# Patient Record
Sex: Male | Born: 1955 | Race: White | Hispanic: No | Marital: Married | Smoking: Current some day smoker
Health system: Southern US, Community
[De-identification: ages and names within clinical notes are randomized; demographics above are authoritative.]

## PROBLEM LIST (undated history)

## (undated) DIAGNOSIS — T7840XA Allergy, unspecified, initial encounter: Secondary | ICD-10-CM

## (undated) DIAGNOSIS — C4490 Unspecified malignant neoplasm of skin, unspecified: Secondary | ICD-10-CM

## (undated) DIAGNOSIS — E785 Hyperlipidemia, unspecified: Secondary | ICD-10-CM

## (undated) DIAGNOSIS — R251 Tremor, unspecified: Secondary | ICD-10-CM

## (undated) DIAGNOSIS — F909 Attention-deficit hyperactivity disorder, unspecified type: Secondary | ICD-10-CM

## (undated) HISTORY — PX: OTHER SURGICAL HISTORY: SHX169

## (undated) HISTORY — DX: Attention-deficit hyperactivity disorder, unspecified type: F90.9

## (undated) HISTORY — DX: Unspecified malignant neoplasm of skin, unspecified: C44.90

## (undated) HISTORY — DX: Tremor, unspecified: R25.1

## (undated) HISTORY — DX: Allergy, unspecified, initial encounter: T78.40XA

## (undated) HISTORY — DX: Hyperlipidemia, unspecified: E78.5

---

## 1977-05-08 HISTORY — PX: KNEE SURGERY: SHX244

## 2004-03-15 ENCOUNTER — Ambulatory Visit: Payer: Self-pay | Admitting: Family Medicine

## 2004-04-13 ENCOUNTER — Ambulatory Visit: Payer: Self-pay | Admitting: Family Medicine

## 2004-06-08 ENCOUNTER — Ambulatory Visit: Payer: Self-pay | Admitting: Family Medicine

## 2004-08-25 ENCOUNTER — Ambulatory Visit: Payer: Self-pay | Admitting: Family Medicine

## 2005-01-26 ENCOUNTER — Ambulatory Visit: Payer: Self-pay | Admitting: Family Medicine

## 2005-02-01 ENCOUNTER — Ambulatory Visit: Payer: Self-pay | Admitting: Family Medicine

## 2005-02-20 ENCOUNTER — Ambulatory Visit: Payer: Self-pay | Admitting: Family Medicine

## 2005-06-01 ENCOUNTER — Ambulatory Visit: Payer: Self-pay | Admitting: Family Medicine

## 2006-02-09 ENCOUNTER — Ambulatory Visit: Payer: Self-pay | Admitting: Family Medicine

## 2006-02-15 ENCOUNTER — Ambulatory Visit: Payer: Self-pay | Admitting: Family Medicine

## 2006-05-24 ENCOUNTER — Ambulatory Visit: Payer: Self-pay

## 2006-05-24 ENCOUNTER — Ambulatory Visit: Payer: Self-pay | Admitting: *Deleted

## 2007-01-04 DIAGNOSIS — J309 Allergic rhinitis, unspecified: Secondary | ICD-10-CM | POA: Insufficient documentation

## 2007-01-21 ENCOUNTER — Telehealth: Payer: Self-pay | Admitting: Family Medicine

## 2007-02-11 ENCOUNTER — Telehealth: Payer: Self-pay | Admitting: Family Medicine

## 2007-02-12 ENCOUNTER — Ambulatory Visit: Payer: Self-pay | Admitting: Family Medicine

## 2007-02-12 LAB — CONVERTED CEMR LAB
ALT: 23 units/L (ref 0–53)
Alkaline Phosphatase: 53 units/L (ref 39–117)
CO2: 31 meq/L (ref 19–32)
Calcium: 9.5 mg/dL (ref 8.4–10.5)
Chloride: 106 meq/L (ref 96–112)
Eosinophils Absolute: 0.2 10*3/uL (ref 0.0–0.6)
GFR calc Af Amer: 102 mL/min
GFR calc non Af Amer: 84 mL/min
Glucose, Bld: 91 mg/dL (ref 70–99)
HDL: 36 mg/dL — ABNORMAL LOW (ref >39.0)
LDL Cholesterol: 100 mg/dL — ABNORMAL HIGH (ref 0–99)
MCHC: 35.3 g/dL (ref 30.0–36.0)
MCV: 95.6 fL (ref 78.0–100.0)
Neutro Abs: 2.5 10*3/uL (ref 1.4–7.7)
Neutrophils Relative %: 49.8 % (ref 43.0–77.0)
Potassium: 5 meq/L (ref 3.5–5.1)
Total CHOL/HDL Ratio: 4.3
Total Protein: 6.6 g/dL (ref 6.0–8.3)
Triglycerides: 99 mg/dL (ref 0–149)
WBC: 5.2 10*3/uL (ref 4.5–10.5)

## 2007-03-05 ENCOUNTER — Telehealth: Payer: Self-pay | Admitting: Family Medicine

## 2007-03-08 ENCOUNTER — Encounter: Payer: Self-pay | Admitting: Family Medicine

## 2007-03-11 ENCOUNTER — Telehealth: Payer: Self-pay | Admitting: Family Medicine

## 2007-03-18 ENCOUNTER — Telehealth (INDEPENDENT_AMBULATORY_CARE_PROVIDER_SITE_OTHER): Payer: Self-pay | Admitting: *Deleted

## 2007-03-29 ENCOUNTER — Telehealth: Payer: Self-pay | Admitting: Family Medicine

## 2007-04-19 ENCOUNTER — Telehealth: Payer: Self-pay | Admitting: Family Medicine

## 2007-06-12 ENCOUNTER — Ambulatory Visit: Payer: Self-pay | Admitting: Internal Medicine

## 2007-06-25 ENCOUNTER — Encounter: Payer: Self-pay | Admitting: Internal Medicine

## 2007-06-25 ENCOUNTER — Ambulatory Visit: Payer: Self-pay | Admitting: Internal Medicine

## 2007-08-12 ENCOUNTER — Telehealth: Payer: Self-pay | Admitting: Family Medicine

## 2007-08-20 ENCOUNTER — Telehealth (INDEPENDENT_AMBULATORY_CARE_PROVIDER_SITE_OTHER): Payer: Self-pay | Admitting: *Deleted

## 2007-08-30 ENCOUNTER — Ambulatory Visit: Payer: Self-pay | Admitting: Family Medicine

## 2007-10-01 ENCOUNTER — Telehealth: Payer: Self-pay | Admitting: Family Medicine

## 2007-10-02 ENCOUNTER — Ambulatory Visit: Payer: Self-pay | Admitting: Family Medicine

## 2007-12-03 ENCOUNTER — Ambulatory Visit: Payer: Self-pay | Admitting: Family Medicine

## 2007-12-03 LAB — CONVERTED CEMR LAB
AST: 23 units/L (ref 0–37)
Albumin: 4.2 g/dL (ref 3.5–5.2)
BUN: 13 mg/dL (ref 6–23)
Basophils Absolute: 0 10*3/uL (ref 0.0–0.1)
Bilirubin, Direct: 0.1 mg/dL (ref 0.0–0.3)
Chloride: 104 meq/L (ref 96–112)
Eosinophils Relative: 4.4 % (ref 0.0–5.0)
GFR calc Af Amer: 91 mL/min
HCT: 48.3 % (ref 39.0–52.0)
LDL Cholesterol: 87 mg/dL (ref 0–99)
MCHC: 33.7 g/dL (ref 30.0–36.0)
MCV: 100.2 fL — ABNORMAL HIGH (ref 78.0–100.0)
Monocytes Absolute: 0.5 10*3/uL (ref 0.1–1.0)
Monocytes Relative: 9.8 % (ref 3.0–12.0)
Nitrite: NEGATIVE
PSA: 0.33 ng/mL (ref 0.10–4.00)
Platelets: 257 10*3/uL (ref 150–400)
RDW: 12.1 % (ref 11.5–14.6)
Sodium: 140 meq/L (ref 135–145)
Specific Gravity, Urine: 1.025
TSH: 1.08 microintl units/mL (ref 0.35–5.50)
Total CHOL/HDL Ratio: 4
VLDL: 25 mg/dL (ref 0–40)
WBC Urine, dipstick: NEGATIVE

## 2007-12-10 ENCOUNTER — Ambulatory Visit: Payer: Self-pay | Admitting: Family Medicine

## 2007-12-10 DIAGNOSIS — R3129 Other microscopic hematuria: Secondary | ICD-10-CM | POA: Insufficient documentation

## 2007-12-10 DIAGNOSIS — E785 Hyperlipidemia, unspecified: Secondary | ICD-10-CM | POA: Insufficient documentation

## 2007-12-10 DIAGNOSIS — G250 Essential tremor: Secondary | ICD-10-CM

## 2008-03-23 DIAGNOSIS — J1189 Influenza due to unidentified influenza virus with other manifestations: Secondary | ICD-10-CM

## 2008-03-24 ENCOUNTER — Ambulatory Visit: Payer: Self-pay | Admitting: Family Medicine

## 2008-05-12 ENCOUNTER — Ambulatory Visit: Payer: Self-pay | Admitting: Family Medicine

## 2008-05-12 DIAGNOSIS — R209 Unspecified disturbances of skin sensation: Secondary | ICD-10-CM | POA: Insufficient documentation

## 2008-10-01 ENCOUNTER — Ambulatory Visit: Payer: Self-pay | Admitting: Family Medicine

## 2008-12-28 ENCOUNTER — Telehealth: Payer: Self-pay | Admitting: *Deleted

## 2009-02-23 ENCOUNTER — Telehealth: Payer: Self-pay | Admitting: Family Medicine

## 2009-02-23 DIAGNOSIS — F17200 Nicotine dependence, unspecified, uncomplicated: Secondary | ICD-10-CM

## 2009-03-05 ENCOUNTER — Ambulatory Visit: Payer: Self-pay | Admitting: Family Medicine

## 2009-03-08 ENCOUNTER — Telehealth: Payer: Self-pay | Admitting: Family Medicine

## 2009-03-18 ENCOUNTER — Ambulatory Visit: Payer: Self-pay | Admitting: Family Medicine

## 2009-03-18 LAB — CONVERTED CEMR LAB
ALT: 22 units/L (ref 0–53)
AST: 26 units/L (ref 0–37)
Albumin: 4.5 g/dL (ref 3.5–5.2)
Alkaline Phosphatase: 57 units/L (ref 39–117)
BUN: 20 mg/dL (ref 6–23)
Basophils Absolute: 0 10*3/uL (ref 0.0–0.1)
Basophils Relative: 0 % (ref 0.0–3.0)
Bilirubin Urine: NEGATIVE
Bilirubin, Direct: 0 mg/dL (ref 0.0–0.3)
CO2: 29 meq/L (ref 19–32)
Calcium: 9.7 mg/dL (ref 8.4–10.5)
Chloride: 109 meq/L (ref 96–112)
Cholesterol: 159 mg/dL (ref 0–200)
Creatinine, Ser: 1.1 mg/dL (ref 0.4–1.5)
Eosinophils Absolute: 0.2 10*3/uL (ref 0.0–0.7)
Eosinophils Relative: 3.1 % (ref 0.0–5.0)
GFR calc non Af Amer: 74.57 mL/min (ref >60)
Glucose, Bld: 102 mg/dL — ABNORMAL HIGH (ref 70–99)
Glucose, Urine, Semiquant: NEGATIVE
HCT: 45.1 % (ref 39.0–52.0)
HDL: 35.1 mg/dL — ABNORMAL LOW (ref >39.00)
Hemoglobin: 16 g/dL (ref 13.0–17.0)
Ketones, urine, test strip: NEGATIVE
LDL Cholesterol: 99 mg/dL (ref 0–99)
Lymphocytes Relative: 29.1 % (ref 12.0–46.0)
Lymphs Abs: 1.5 10*3/uL (ref 0.7–4.0)
MCHC: 35.6 g/dL (ref 30.0–36.0)
MCV: 99 fL (ref 78.0–100.0)
Monocytes Absolute: 0.4 10*3/uL (ref 0.1–1.0)
Monocytes Relative: 7.9 % (ref 3.0–12.0)
Neutro Abs: 2.9 10*3/uL (ref 1.4–7.7)
Neutrophils Relative %: 59.9 % (ref 43.0–77.0)
Nitrite: NEGATIVE
PSA: 0.38 ng/mL (ref 0.10–4.00)
Platelets: 229 10*3/uL (ref 150.0–400.0)
Potassium: 5 meq/L (ref 3.5–5.1)
Protein, U semiquant: NEGATIVE
RBC: 4.55 M/uL (ref 4.22–5.81)
RDW: 11.7 % (ref 11.5–14.6)
Sodium: 148 meq/L — ABNORMAL HIGH (ref 135–145)
Specific Gravity, Urine: 1.025
TSH: 1.46 microintl units/mL (ref 0.35–5.50)
Total Bilirubin: 0.9 mg/dL (ref 0.3–1.2)
Total CHOL/HDL Ratio: 5
Total Protein: 7.7 g/dL (ref 6.0–8.3)
Triglycerides: 123 mg/dL (ref 0.0–149.0)
Urobilinogen, UA: 0.2
VLDL: 24.6 mg/dL (ref 0.0–40.0)
WBC Urine, dipstick: NEGATIVE
WBC: 5 10*3/uL (ref 4.5–10.5)
pH: 5.5

## 2009-04-05 ENCOUNTER — Ambulatory Visit: Payer: Self-pay | Admitting: Family Medicine

## 2009-04-05 DIAGNOSIS — I471 Supraventricular tachycardia, unspecified: Secondary | ICD-10-CM | POA: Insufficient documentation

## 2009-04-05 DIAGNOSIS — M542 Cervicalgia: Secondary | ICD-10-CM

## 2009-05-20 ENCOUNTER — Ambulatory Visit: Payer: Self-pay | Admitting: Family Medicine

## 2009-10-13 ENCOUNTER — Telehealth: Payer: Self-pay | Admitting: Family Medicine

## 2009-10-18 ENCOUNTER — Telehealth: Payer: Self-pay | Admitting: *Deleted

## 2010-02-07 ENCOUNTER — Telehealth: Payer: Self-pay | Admitting: Family Medicine

## 2010-02-23 ENCOUNTER — Telehealth: Payer: Self-pay | Admitting: Family Medicine

## 2010-03-24 ENCOUNTER — Ambulatory Visit: Payer: Self-pay | Admitting: Family Medicine

## 2010-03-24 LAB — CONVERTED CEMR LAB
AST: 22 units/L (ref 0–37)
Albumin: 4.2 g/dL (ref 3.5–5.2)
Alkaline Phosphatase: 50 units/L (ref 39–117)
BUN: 25 mg/dL — ABNORMAL HIGH (ref 6–23)
Basophils Relative: 0.4 % (ref 0.0–3.0)
Chloride: 104 meq/L (ref 96–112)
Cholesterol: 158 mg/dL (ref 0–200)
Eosinophils Absolute: 0.2 10*3/uL (ref 0.0–0.7)
Eosinophils Relative: 3 % (ref 0.0–5.0)
Glucose, Bld: 100 mg/dL — ABNORMAL HIGH (ref 70–99)
HCT: 46.2 % (ref 39.0–52.0)
LDL Cholesterol: 103 mg/dL — ABNORMAL HIGH (ref 0–99)
Lymphocytes Relative: 34.1 % (ref 12.0–46.0)
MCV: 98.7 fL (ref 78.0–100.0)
Monocytes Absolute: 0.5 10*3/uL (ref 0.1–1.0)
Monocytes Relative: 9.7 % (ref 3.0–12.0)
Neutrophils Relative %: 52.8 % (ref 43.0–77.0)
Platelets: 224 10*3/uL (ref 150.0–400.0)
Potassium: 4.7 meq/L (ref 3.5–5.1)
Protein, U semiquant: NEGATIVE
Sodium: 140 meq/L (ref 135–145)
TSH: 1.74 microintl units/mL (ref 0.35–5.50)
Total Protein: 6.7 g/dL (ref 6.0–8.3)
Urobilinogen, UA: 0.2

## 2010-04-11 ENCOUNTER — Ambulatory Visit: Payer: Self-pay | Admitting: Family Medicine

## 2010-04-11 ENCOUNTER — Encounter: Payer: Self-pay | Admitting: Family Medicine

## 2010-06-09 NOTE — Assessment & Plan Note (Signed)
Summary: n/congestion/ha/njr   Vital Signs:  Patient profile:   55 year old male Weight:      208 pounds O2 Sat:      95 % on Room air Temp:     100.1 degrees F oral Pulse rate:   84 / minute BP sitting:   120 / 76  (left arm)  Vitals Entered By: Doristine Devoid (May 20, 2009 9:43 AM)  O2 Flow:  Room air CC: sinus congestion and bodyaches along w/ chills xmon.    CC:  sinus congestion and bodyaches along w/ chills xmon. Marland Kitchen  History of Present Illness: greg is a 55 year old, married male, nonsmoker, who came to in with a 5-day history of fever, chills, aching all over, sore throat, head congestion, nonproductive cough.  Temp today is 101.  He states his daughter had similar symptoms.  Review of systems negative  Allergies: No Known Drug Allergies  Review of Systems      See HPI  Physical Exam  General:  Well-developed,well-nourished,in no acute distress; alert,appropriate and cooperative throughout examination Head:  Normocephalic and atraumatic without obvious abnormalities. No apparent alopecia or balding. Eyes:  No corneal or conjunctival inflammation noted. EOMI. Perrla. Funduscopic exam benign, without hemorrhages, exudates or papilledema. Vision grossly normal. Ears:  External ear exam shows no significant lesions or deformities.  Otoscopic examination reveals clear canals, tympanic membranes are intact bilaterally without bulging, retraction, inflammation or discharge. Hearing is grossly normal bilaterally. Nose:  External nasal examination shows no deformity or inflammation. Nasal mucosa are pink and moist without lesions or exudates. Mouth:  Oral mucosa and oropharynx without lesions or exudates.  Teeth in good repair. Neck:  No deformities, masses, or tenderness noted. Chest Wall:  No deformities, masses, tenderness or gynecomastia noted. Breasts:  No masses or gynecomastia noted Lungs:  Normal respiratory effort, chest expands symmetrically. Lungs are clear to  auscultation, no crackles or wheezes.   Problems:  Medical Problems Added: 1)  Dx of Influenza, With Respiratory Symptoms  (ICD-487.1)  Impression & Recommendations:  Problem # 1:  INFLUENZA, WITH RESPIRATORY SYMPTOMS (ICD-487.1) Assessment New  Orders: Prescription Created Electronically [phone])  Complete Medication List: 1)  Simvastatin 10 Mg Tabs (Simvastatin) .Marland Kitchen.. 1 tab at bedtime; cpx when due 2)  Bayer Aspirin 325 Mg Tabs (Aspirin) .... Once daily 3)  Multivitamins Caps (Multiple vitamin) .... Once daily 4)  Zyrtec Allergy 10 Mg Tabs (Cetirizine hcl) .... Once daily 5)  Valium 5 Mg Tabs (Diazepam) .Marland Kitchen.. 1 tab one hour prior to flying. 6)  Hydromet 5-1.5 Mg/20ml Syrp (Hydrocodone-homatropine) .Marland Kitchen.. 1 or 2 tsps three times a day as needed  Patient Instructions: 1)  Get plenty of rest, drink lots of clear liquids, and use Tylenol or Ibuprofen for fever and comfort. Return in 7-10 days if you're not better:sooner if you're feeling worse. 2)  Take 650-1000mg  of Tylenol every 4-6 hours as needed for relief of pain or comfort of fever AVOID taking more than 4000mg   in a 24 hour period (can cause liver damage in higher doses). 3)  drink 30 ounces of water daily, run a vaporizer in her bedroom, take one or 2 teaspoons of Hydromet, up to 3 times a day as needed. 4)  Return p.r.n.Marland Kitchen Prescriptions: HYDROMET 5-1.5 MG/5ML SYRP (HYDROCODONE-HOMATROPINE) 1 or 2 tsps three times a day as needed  #8oz x 1   Entered and Authorized by:   Roderick Pee MD   Signed by:   Roderick Pee MD on 05/20/2009  Method used:   Print then Give to Patient   RxID:   [id number]   Laboratory Results    Other Tests  Influenza A: negative Influenza B: negative  Kit Test Internal QC: Positive   (Normal Range: Negative)

## 2010-06-09 NOTE — Progress Notes (Signed)
Summary: REQ FOR ORDER  Phone Note Call from Patient   Caller: Patient   Summary of Call: Pt called to schedule a cpx appt for 04/16/2010 .... Pt is requesting that he obtain an order for chest x-ray and a throat x-ray? due to the amount of cigars that he smokes on a daily basis.... Can you advise order for same?   Initial call taken by: Debbra Riding,  February 07, 2010 10:55 AM  Follow-up for Phone Call        we will discuss screening tests when he comes in for his appointment Follow-up by: Roderick Pee MD,  February 07, 2010 11:57 AM  Additional Follow-up for Phone Call Additional follow up Details #1::        LMOMTCB.  Additional Follow-up by: Debbra Riding,  February 07, 2010 12:36 PM

## 2010-06-09 NOTE — Assessment & Plan Note (Signed)
Summary: CPX // RS/PT [facility] FROM [facility]   Vital Signs:  Patient profile:   55 year old male Height:      69.75 inches Weight:      209 pounds BMI:     30.31 Temp:     98.0 degrees F oral BP sitting:   120 / 78  (left arm) Cuff size:   regular  Vitals Entered By: Kern Reap CMA Duncan Dull) (April 11, 2010 4:00 PM) CC: cpx Is Patient Diabetic? No Pain Assessment Patient in pain? no        CC:  cpx.  History of Present Illness: Sharlot Gowda is a 55 year old, married male, nonsmoker, who comes in today for a general physical examination because of a history of hyperlipidemia, allergic rhinitis.  He takes simvastatin 10 mg nightly and aspirin tablet daily with [place] or go.  He also takes 10 mg of plain Zyrtec at bedtime for allergic rhinitis and a 5-mg Valium tablets prior to flying.  Review of systems negative except is having some muscle spasm of his right sternocleidomastoid muscle.  Review of systems negative.  Tetanus 2004 seasonal flu shot 2011 colonoscopy normal 2008  Allergies (verified): No Known Drug Allergies  Past History:  Past medical, surgical, family and social histories (including risk factors) reviewed for relevance to current acute and chronic problems.  Past Medical History: Reviewed history from 12/10/2007 and no changes required. Allergic rhinitis ADHD Benign Essential tremor Asy, hematuria Hyperlipidemia  Past Surgical History: Reviewed history from 01/04/2007 and no changes required. Right knee sx  Family History: Reviewed history from 01/04/2007 and no changes required. Family History of Aneurysm Aortic Family History Diabetes 1st degree relative Family History Hypertension Family History Other cancer-breast Family History of Stroke M 1st degree relative <50  Social History: Reviewed history from 12/10/2007 and no changes required. Married Never Smoked Alcohol use-yes Drug use-no Occupation: Sport and exercise psychologist  Review of Systems     See HPI  Physical Exam  General:  Well-developed,well-nourished,in no acute distress; alert,appropriate and cooperative throughout examination Head:  Normocephalic and atraumatic without obvious abnormalities. No apparent alopecia or balding. Eyes:  No corneal or conjunctival inflammation noted. EOMI. Perrla. Funduscopic exam benign, without hemorrhages, exudates or papilledema. Vision grossly normal. Ears:  External ear exam shows no significant lesions or deformities.  Otoscopic examination reveals clear canals, tympanic membranes are intact bilaterally without bulging, retraction, inflammation or discharge. Hearing is grossly normal bilaterally. Nose:  External nasal examination shows no deformity or inflammation. Nasal mucosa are pink and moist without lesions or exudates. Mouth:  Oral mucosa and oropharynx without lesions or exudates.  Teeth in good repair. Neck:  No deformities, masses, or tenderness noted. Chest Wall:  No deformities, masses, tenderness or gynecomastia noted. Breasts:  No masses or gynecomastia noted Lungs:  Normal respiratory effort, chest expands symmetrically. Lungs are clear to auscultation, no crackles or wheezes. Heart:  Normal rate and regular rhythm. S1 and S2 normal without gallop, murmur, click, rub or other extra sounds. Abdomen:  Bowel sounds positive,abdomen soft and non-tender without masses, organomegaly or hernias noted. Rectal:  No external abnormalities noted. Normal sphincter tone. No rectal masses or tenderness. Genitalia:  Testes bilaterally descended without nodularity, tenderness or masses. No scrotal masses or lesions. No penis lesions or urethral discharge. Prostate:  Prostate gland firm and smooth, no enlargement, nodularity, tenderness, mass, asymmetry or induration. Msk:  No deformity or scoliosis noted of thoracic or lumbar spine.   Pulses:  R and L carotid,radial,femoral,dorsalis pedis  and posterior tibial pulses are full and equal  bilaterally Extremities:  No clubbing, cyanosis, edema, or deformity noted with normal full range of motion of all joints.   Neurologic:  No cranial nerve deficits noted. Station and gait are normal. Plantar reflexes are down-going bilaterally. DTRs are symmetrical throughout. Sensory, motor and coordinative functions appear intact. Skin:  total body skin exam normal except for scar in his nose.  Left shoulder from previous skin cancer removed by Dr. Yetta Barre Cervical Nodes:  No lymphadenopathy noted Axillary Nodes:  No palpable lymphadenopathy Inguinal Nodes:  No significant adenopathy Psych:  Cognition and judgment appear intact. Alert and cooperative with normal attention span and concentration. No apparent delusions, illusions, hallucinations   Impression & Recommendations:  Problem # 1:  TREMOR, ESSENTIAL (ICD-333.1) Assessment Unchanged  Orders: Prescription Created Electronically [id number])  Problem # 2:  HYPERLIPIDEMIA (ICD-272.4) Assessment: Improved  His updated medication list for this problem includes:    Simvastatin 10 Mg Tabs (Simvastatin) .Marland Kitchen... 1 tab at bedtime; cpx when due  Orders: EKG w/ Interpretation (93000)  Problem # 3:  PHYSICAL EXAMINATION (ICD-V70.0) Assessment: Unchanged  Orders: Prescription Created Electronically [phone]) EKG w/ Interpretation (93000)  Complete Medication List: 1)  Simvastatin 10 Mg Tabs (Simvastatin) .Marland Kitchen.. 1 tab at bedtime; cpx when due 2)  Bayer Aspirin 325 Mg Tabs (Aspirin) .... Once daily 3)  Multivitamins Caps (Multiple vitamin) .... Once daily 4)  Zyrtec Allergy 10 Mg Tabs (Cetirizine hcl) .... Once daily 5)  Valium 5 Mg Tabs (Diazepam) .Marland Kitchen.. 1 tab one hour prior to flying.  Other Orders: Physical Therapy Referral (PT)  Patient Instructions: 1)  I will set up an appointment for you to see Jeanene Erb, my physical therapist to help you with the neck spasms 2)  Please schedule a follow-up appointment in 1 year. 3)  It is  important that you exercise regularly at least 20 minutes 5 times a week. If you develop chest pain, have severe difficulty breathing, or feel very tired , stop exercising immediately and seek medical attention. 4)  Take an Aspirin every day. Prescriptions: VALIUM 5 MG TABS (DIAZEPAM) 1 tab one hour prior to flying.  #10 x 1    Entered and Authorized by:   Roderick Pee MD   Signed by:   Roderick Pee MD on 04/11/2010   Method used:   Print then Give to Patient   RxID:   [id number] SIMVASTATIN 10 MG  TABS (SIMVASTATIN) 1 tab at bedtime; cpx when due  #100 x 3   Entered and Authorized by:   Roderick Pee MD   Signed by:   Roderick Pee MD on 04/11/2010   Method used:   Electronically to        [facility]* (retail)       [address]       [place], [place]  [id number]       Ph: [phone]       Fax: [phone]   RxID:   [id number]    Orders Added: 1)  Physical Therapy Referral [PT] 2)  Prescription Created Electronically [G8553] 3)  Est. Patient 40-64 years [99396] 4)  EKG w/ Interpretation [93000]      Preventive Care Screening  Colonoscopy:    Date:  05/08/2006    Results:  normal

## 2010-06-09 NOTE — Progress Notes (Signed)
Summary: REQ FOR ORDER   Phone Note Call from Patient   Caller: Patient  [phone] Reason for Call: Talk to Doctor Summary of Call: Pt called to adv that he is still exp cramping in his throat / neck area.... Pt adv that he would like to have x-rays done of his neck to help find out what is going on with his throat / neck.... Pt requesting order for same... Pt adv that he has been in to see Dr Tawanna Cooler about this problem...Marland KitchenMarland KitchenCan you advise?  Initial call taken by: Debbra Riding,  October 18, 2009 3:19 PM  Follow-up for Phone Call        [place]......... please call before I would expose him to any radiation.  I would recommend he see Dr. Ezzard Standing again or if they can't find out what the problem.  This will get him set up to see a neurologist Follow-up by: Roderick Pee MD,  October 18, 2009 3:36 PM  Additional Follow-up for Phone Call Additional follow up Details #1::        Called pt explained that Dr. Ezzard Standing is a specialist for throats and that would be the best person to id his problem. Told him to call us back if he doesn't get any answers & we can send him to a neurologist. Additional Follow-up by: Kathrynn Speed CMA,  October 19, 2009 9:49 AM

## 2010-06-09 NOTE — Progress Notes (Signed)
Summary: neck cramping & getting tight  Phone Note Call from Patient Call back at Home Phone [phone]   Caller: vm Summary of Call: Problems with neck cramping & getting tight.  A couple months ago had CXR but did not have neck & jaw underneath xrays.  Dr. Ezzard Standing, ENT, saw me also.  Then cut off.    Initial call taken by: Rudy Jew, RN,  October 13, 2009 4:50 PM  Follow-up for Phone Call        ov Follow-up by: Roderick Pee MD,  October 14, 2009 8:34 AM

## 2010-06-09 NOTE — Progress Notes (Signed)
Summary: Pt wants chest and neck xray done before sch cpx  Phone Note Call from Patient Call back at Home Phone [phone]   Caller: Patient Summary of Call: Pt is req to get chest and neck / throat xray done before he comes in for his cpx, so that way, Dr. Tawanna Cooler will have the results and can go over them during his cpx. Pls order the above mentioned xray and notify pt. Pt says that he will pay for xrays out of pocket.  Initial call taken by: Lucy Antigua,  February 23, 2010 9:49 AM  Follow-up for Phone Call        after we see him and examined him and then we will discuss imaging if necessary Follow-up by: Roderick Pee MD,  February 23, 2010 10:55 AM  Additional Follow-up for Phone Call Additional follow up Details #1::        left message on machine  Additional Follow-up by: Kern Reap CMA Duncan Dull),  February 23, 2010 12:14 PM

## 2010-07-04 ENCOUNTER — Encounter: Payer: Self-pay | Admitting: Family Medicine

## 2010-07-04 ENCOUNTER — Ambulatory Visit (INDEPENDENT_AMBULATORY_CARE_PROVIDER_SITE_OTHER): Payer: BC Managed Care – PPO | Admitting: Family Medicine

## 2010-07-04 VITALS — BP 110/80 | Temp 98.3°F | Ht 70.5 in | Wt 213.0 lb

## 2010-07-04 DIAGNOSIS — R5383 Other fatigue: Secondary | ICD-10-CM

## 2010-07-04 DIAGNOSIS — E785 Hyperlipidemia, unspecified: Secondary | ICD-10-CM

## 2010-07-04 DIAGNOSIS — R5381 Other malaise: Secondary | ICD-10-CM

## 2010-07-04 LAB — POCT URINALYSIS DIPSTICK
Bilirubin, UA: NEGATIVE
Glucose, UA: NEGATIVE
Leukocytes, UA: NEGATIVE
pH, UA: 6

## 2010-07-04 NOTE — Progress Notes (Signed)
  Subjective:    Patient ID: Dakota Barker, male    DOB: [birth year], 55 y.o.   MRN: [id number]  HPI  Dakota Barker is a 55 year old,  married male, smoker,,,,,,,,, cigars,,,,,,,, who comes in today for evaluation of fatigue x 3 to 4 months.  He states he feels tired.  He has no energy.  He comes home from work and has taken that he sleeps throughout the night except he gets up once or twice to urinate.  He's had no fever, earache, sore throat, cough, no nausea, vomiting, diarrhea, etc., etc.  Weight is steady.  He continues to exercise and is somewhat sporadic basis.  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  He is married and has a 93 year old daughter in Social worker school at [employer].  A66 54-year-old in [place] and a 54 year old son in [place] so children all doing well.  Personal life is good.  Marriage is okay.  Family history non-contributory.  Except half brother with diabetes  Review of Systems  Constitutional: Negative.   HENT: Negative.   Eyes: Negative.   Respiratory: Negative.   Cardiovascular: Negative.   Gastrointestinal: Negative.   Genitourinary: Negative.   Musculoskeletal: Negative.   Skin: Negative.   Neurological: Negative.   Hematological: Negative.   Psychiatric/Behavioral: Negative.        Objective:   Physical Exam  Constitutional: He appears well-developed and well-nourished. No distress.  Skin: He is not diaphoretic.  Psychiatric: He has a normal mood and affect. His behavior is normal. Judgment and thought content normal.          Assessment & Plan:  Fatigue,,,,,,,,, begin workup

## 2010-07-04 NOTE — Patient Instructions (Signed)
I will call you I gets y  lab work back

## 2010-07-05 LAB — BASIC METABOLIC PANEL
CO2: 28 mEq/L (ref 19–32)
Calcium: 9.3 mg/dL (ref 8.4–10.5)
Glucose, Bld: 90 mg/dL (ref 70–99)
Potassium: 4.7 mEq/L (ref 3.5–5.1)
Sodium: 139 mEq/L (ref 135–145)

## 2010-07-05 LAB — CBC WITH DIFFERENTIAL/PLATELET
Basophils Absolute: 0 10*3/uL (ref 0.0–0.1)
Eosinophils Absolute: 0.2 10*3/uL (ref 0.0–0.7)
HCT: 43.8 % (ref 39.0–52.0)
Hemoglobin: 15.2 g/dL (ref 13.0–17.0)
Lymphs Abs: 1.8 10*3/uL (ref 0.7–4.0)
MCHC: 34.6 g/dL (ref 30.0–36.0)
Neutro Abs: 4.5 10*3/uL (ref 1.4–7.7)
RDW: 12.9 % (ref 11.5–14.6)

## 2010-07-05 LAB — T3, FREE: T3, Free: 2.7 pg/mL (ref 2.3–4.2)

## 2010-07-05 LAB — HEPATIC FUNCTION PANEL: Albumin: 4.2 g/dL (ref 3.5–5.2)

## 2010-07-05 LAB — LDL CHOLESTEROL, DIRECT: Direct LDL: 87.8 mg/dL

## 2010-07-05 LAB — T4, FREE: Free T4: 0.91 ng/dL (ref 0.60–1.60)

## 2010-07-05 LAB — LIPID PANEL
Cholesterol: 145 mg/dL (ref 0–200)
Triglycerides: 260 mg/dL — ABNORMAL HIGH (ref 0.0–149.0)

## 2010-07-05 LAB — TSH: TSH: 1.26 u[IU]/mL (ref 0.35–5.50)

## 2010-07-25 ENCOUNTER — Telehealth: Payer: Self-pay | Admitting: Family Medicine

## 2010-07-25 NOTE — Telephone Encounter (Signed)
Left message on machine for patient  No generic available

## 2010-07-25 NOTE — Telephone Encounter (Signed)
Please send a generic for testosterone to [facility]. Patient does not know what is covered.

## 2010-08-01 NOTE — Telephone Encounter (Signed)
Pt called back and said that whatever testosterone is covered is fine. Pls call in to [facility] today.

## 2010-08-01 NOTE — Telephone Encounter (Signed)
Please call ............... The patient needs to call his insurance company to find out what they cover and then let us know what they will pay for

## 2010-09-01 ENCOUNTER — Encounter: Payer: Self-pay | Admitting: Family Medicine

## 2010-09-01 ENCOUNTER — Ambulatory Visit (INDEPENDENT_AMBULATORY_CARE_PROVIDER_SITE_OTHER): Payer: BC Managed Care – PPO | Admitting: Family Medicine

## 2010-09-01 VITALS — BP 110/70 | Temp 98.2°F | Wt 210.0 lb

## 2010-09-01 DIAGNOSIS — R5383 Other fatigue: Secondary | ICD-10-CM

## 2010-09-01 NOTE — Patient Instructions (Signed)
We will get to set up a consult with Dr. Everardo All

## 2010-09-01 NOTE — Progress Notes (Signed)
  Subjective:    Patient ID: Dakota Barker, male    DOB: 1955/07/25, 55 y.o.   MRN: [id number]  HPIGregg is a 55 year old, married male, nonsmoker comes in today for follow-up of fatigue.  We did a complete metabolic workup.  Barker his tests are normal except a slightly low testosterone level.  He denies any history of sexual dysfunction.  Low libido, etc..  We had a thorough discussion.  We talked about the options.  I would recommend we get a consult from Dakota Barker to see if he feels this is indeed a metabolic problem and if Dr. Everardo Barker does not, feel it's a metabolic problem, then I think we are to get a consult from psychiatry.  Dakota Barker    Review of Systems Complete review of systems otherwise negative    Objective:   Physical Exam Well-developed well-nourished, male in no acute distress       Assessment & Plan:  Fatigue etiology unknown question related to low testosterone,,,,,,,,,, refer to Dr. Everardo Barker for consult

## 2010-09-12 ENCOUNTER — Encounter: Payer: Self-pay | Admitting: Endocrinology

## 2010-09-12 ENCOUNTER — Ambulatory Visit (INDEPENDENT_AMBULATORY_CARE_PROVIDER_SITE_OTHER): Payer: BC Managed Care – PPO | Admitting: Endocrinology

## 2010-09-12 ENCOUNTER — Other Ambulatory Visit: Payer: BC Managed Care – PPO

## 2010-09-12 ENCOUNTER — Ambulatory Visit (INDEPENDENT_AMBULATORY_CARE_PROVIDER_SITE_OTHER)
Admission: RE | Admit: 2010-09-12 | Discharge: 2010-09-12 | Disposition: A | Discharge status: Home / Self Care | Payer: BC Managed Care – PPO | Source: Ambulatory Visit | Attending: Family Medicine | Admitting: Family Medicine

## 2010-09-12 VITALS — BP 106/62 | HR 69 | Temp 98.5°F | Ht 71.0 in | Wt 209.0 lb

## 2010-09-12 DIAGNOSIS — E291 Testicular hypofunction: Secondary | ICD-10-CM

## 2010-09-12 DIAGNOSIS — R5383 Other fatigue: Secondary | ICD-10-CM

## 2010-09-12 NOTE — Progress Notes (Signed)
Subjective:    Patient ID: Dakota Barker, male    DOB: [birth year], 55 y.o.   MRN: [id number]  HPI Pt states few years of fatigue, and mild palpitations in the chest, but no assoc insomnia. Past Medical History  Diagnosis Date  . Allergy   . ADHD (attention deficit hyperactivity disorder)   . Tremor     benign, essential  . Hematuria   . Hyperlipidemia     Past Surgical History  Procedure Date  . Knee surgery     right    History   Social History  . Marital Status: Married    Spouse Name: N/A    Number of Children: N/A  . Years of Education: N/A   Occupational History  . Not on file.   Social History Main Topics  . Smoking status: Current Some Day Smoker -- 2 years    Types: Cigars  . Smokeless tobacco: Never Used  . Alcohol Use: Yes  . Drug Use: No  . Sexually Active:    Other Topics Concern  . Not on file   Social History Narrative  . No narrative on file  married. Works Field seismologist. Current Outpatient Prescriptions on File Prior to Visit  Medication Sig Dispense Refill  . aspirin 325 MG tablet Take 325 mg by mouth daily.        . cetirizine (ZYRTEC) 10 MG chewable tablet Chew 10 mg by mouth as needed.       . diazepam (VALIUM) 5 MG tablet Take 5 mg by mouth as needed. For travel      . Multiple Vitamin (MULTIVITAMIN) tablet Take 1 tablet by mouth daily.        . simvastatin (ZOCOR) 10 MG tablet Take 10 mg by mouth at bedtime.          No Known Allergies  Family History  Problem Relation Age of Onset  . Aneurysm Other     aortic  . Diabetes Other   . Hypertension Other   . Cancer Other     breast  . Stroke Other     less 50 years    BP 106/62  Pulse 69  Temp(Src) 98.5 F (36.9 C) (Oral)  Ht 5\' 11"  (1.803 m)  Wt 209 lb (94.802 kg)  BMI 29.15 kg/m2  SpO2 96%    Review of Systems denies polyuria, depression, numbness, erectile dysfunction, weight change, decreased urinary stream, gynecomastia, muscle weakness, fever,   headache, easy bruising, sob, rash, blurry vision, chest pain.   denies hoarseness, double vision, palpitations, sob, diarrhea, myalgias, excessive diaphoresis, tremor, anxiety, hypoglycemia, and rhinorrhea     Objective:   Physical Exam VS: see vs page GEN: no distress HEAD: head: no deformity eyes: no periorbital swelling, no proptosis external nose and ears are normal mouth: no lesion seen NECK: supple, thyroid is not enlarged CHEST WALL: no deformity BREASTS:  No gynecomastia CV: reg rate and rhythm, no murmur ABD: abdomen is soft, nontender.  no hepatosplenomegaly.  not distended.  no hernia GENITALIA:  Normal male, except testicles are small and soft. MUSCULOSKELETAL: muscle bulk and strength are grossly normal.  no obvious joint swelling.  gait is normal and steady EXTEMITIES: no deformity.  no ulcer on the feet.  feet are of normal color and temp.  no edema PULSES: dorsalis pedis intact bilat.  no carotid bruit NEURO:  cn 2-12 grossly intact.   readily moves all 4's.  sensation is intact to touch on the feet SKIN:  Normal texture and temperature.  No rash or suspicious lesion is visible.   NODES:  None palpable at the neck PSYCH: alert, oriented x3.  Does not appear anxious nor depressed.     Lab Results  Component Value Date   HGBA1C 5.6 07/04/2010   Lab Results  Component Value Date   TSH 1.26 07/04/2010   Lab Results  Component Value Date   TESTOSTERONE 540.16 09/13/2010      Assessment & Plan:  Palpitations, not thyroid-related Hypogonadism, uncertain etiology, resolved. Fatigue, uncertain etiology

## 2010-09-12 NOTE — Patient Instructions (Addendum)
blood tests are being ordered for you today.  please call [phone] to hear your test results.  You will be prompted to enter the 9-digit "MRN" number that appears at the top left of this page, followed by #.  Then you will hear the message. If symptoms are a few hrs after eating, it could be "reactive hypoglycemia" (in which the body's insulin is too little, too late), resulting in transient mildly low blood sugar.  This would be a sign that you are developing diabetes.  The treatment of this is to minimize body weight.  This is something you should do anyway, as you are at risk for diabetes. Return here as needed.   (update: i left message on phone-tree:  rx as we discussed)

## 2010-09-13 ENCOUNTER — Other Ambulatory Visit: Payer: BC Managed Care – PPO

## 2010-09-13 DIAGNOSIS — E291 Testicular hypofunction: Secondary | ICD-10-CM

## 2010-09-13 LAB — TESTOSTERONE: Testosterone: 540.16 ng/dL (ref 250–890)

## 2010-09-23 NOTE — Assessment & Plan Note (Signed)
 HEALTHCARE                            CARDIOLOGY OFFICE NOTE   KHARY, SCHABEN                   MRN:          [id number]  DATE:05/24/2006                            DOB:          27-Jun-1955    Mr. Chain is a very pleasant 55 year old white married male with  history of hyperlipidemia, recently started on Zocor 10 and Niaspan.  He  called in for an evaluation concerning recent peri sternal and left  sternal chest discomfort.  This is non exertional, lasts only a few  seconds, is not nocturnal.  He questiones whether it may occur after  eating.  Risk factors, in addition to hyperlipidemia, include 1 cigar  approximately once a week, occasional beer.   PAST HOSPITALIZATIONS:  For broken heel and knee surgery.   REVIEW OF SYSTEMS:  HEAD, EARS, EYES, NOSE, AND THROAT:  Unremarkable.  CARDIORESPIRATORY:  Noted in the present illness.  GI HISTORY:  Negative except for question of reflux.  GU HISTORY:  Had some nocturia a year and a half ago.  MUSCULOSKELETAL HISTORY:  Unremarkable.  All other review of systems negative.   ALLERGIES:  TO GRASS.  HE TAKES ZYRTEC.   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 [employer].  He walks 20-30 minutes a day.  Married and has 3 children.   FAMILY HISTORY:  Father died at age 51 with congestive heart failure,  mother died of breast cancer.  Maternal grandfather died at 19 of a  heart attack.  He has 2 older brothers, 1 of whom has diabetes, age 34.   PHYSICAL EXAMINATION:  Blood pressure initially was 92/70, repeat was  110/80 left arm, 120/82 right arm.  Pulse 69, normal sinus rhythm.  GENERAL APPEARANCE:  Normal.  HEAD, EARS, NOSE, AND THROAT:  Unremarkable.  JVP is not elevated.  Carotid pulse is palpable and equal without bruits.  LUNGS:  Clear.  CARDIO EXAM:  Reveals no murmur, gallop, or rub.  ABDOMINAL EXAM:  Liver, spleen, kidney not palpable, no masses.  EXTREMITIES:  No edema.  Pulses palpable and equal bilaterally.  NEUROLOGICAL:  Unremarkable.   Baseline EKG is normal.   IMPRESSION:  1. Atypical chest pain.  2. Hyperlipidemia, on therapy.  3. Cigar usage.   I have recommended standard treadmill exercise electrogram with the  report to follow.  I have also strongly recommended he discontinue the  cigar smoking.     Cecil Cranker, MD, [facility]  Electronically Signed    EJL/MedQ  DD: 05/24/2006  DT: 05/24/2006  Job #: [id number]   cc:   Tinnie Gens A. Tawanna Cooler, MD

## 2010-09-23 NOTE — Procedures (Signed)
[facility]                              EXERCISE TREADMILL   KEM, HENSEN                     MRN:          [id number]  DATE:05/24/2006                            DOB:          02-Jun-1955    _Treadmill exercise tolerance test:  The patient has had some recent  atypical chest discomfort, has had a history of hyperlipidemia and  smokes a cigar approximately once a week.  Otherwise, no significant  risk factors.  The patient exercised using the Bruce protocol,  continuing through 3 minutes stage IV.  Maximum heart rate was 151,  which was 90% of predicted maximum.  Blood pressure 163/61.  He had no  symptoms.  There was a rare PVC and no ST-T abnormality.   IMPRESSION:  Negative treadmill exercise electrocardiogram with no  evidence of ST abnormality or symptoms.  He had normal blood pressure  response and there were rare PVCs.     Cecil Cranker, MD, [facility]  Electronically Signed    EJL/MedQ  DD: 05/24/2006  DT: 05/24/2006  Job #: [id number]   cc:   Tinnie Gens A. Tawanna Cooler, MD

## 2010-12-30 ENCOUNTER — Other Ambulatory Visit: Payer: Self-pay | Admitting: Family Medicine

## 2010-12-30 MED ORDER — DIAZEPAM 5 MG PO TABS: 5.0000 mg | ORAL_TABLET | ORAL | Status: DC | PRN

## 2010-12-30 NOTE — Telephone Encounter (Signed)
Pt req refill of diazepam (VALIUM) 5 MG tablet to [facility]. Pt will be traveling end of next wk.

## 2011-01-24 ENCOUNTER — Encounter: Payer: Self-pay | Admitting: Family Medicine

## 2011-01-24 ENCOUNTER — Ambulatory Visit (INDEPENDENT_AMBULATORY_CARE_PROVIDER_SITE_OTHER): Payer: BC Managed Care – PPO | Admitting: Family Medicine

## 2011-01-24 VITALS — BP 120/80 | Temp 98.4°F | Wt 200.0 lb

## 2011-01-24 DIAGNOSIS — R42 Dizziness and giddiness: Secondary | ICD-10-CM | POA: Insufficient documentation

## 2011-01-24 DIAGNOSIS — J309 Allergic rhinitis, unspecified: Secondary | ICD-10-CM

## 2011-01-24 DIAGNOSIS — Z23 Encounter for immunization: Secondary | ICD-10-CM

## 2011-01-24 NOTE — Patient Instructions (Signed)
Return prn 

## 2011-01-24 NOTE — Progress Notes (Signed)
  Subjective:    Patient ID: Dakota Barker, male    DOB: [birth year], 54 y.o.   MRN: [id number]  HPI  Dakota Barker is a  55 year old male, nonsmoker, who comes in today for evaluation of vertigo.  He states last week.  He was in [place] got off an airplane and developed vertigo.  Since that, time it's been intermittent and associated with turning his head to the right.  It lasts for 30 to 60 seconds and go away.  He has two to 3 episodes a day.  He's learned that she doesn't turn his head to the right and he does not develop vertigo.  He has no hearing loss, headache, et Karie Soda.  He's had a history of vertigo about two years ago.  That resolved spontaneously.   Review of Systems    General ENT, and review of systems negative Objective:   Physical Exam Well-developed well-nourished, male, no acute distress.  Examination of the HEENT were negative.  No papilledema.  Neck is supple.  No adenopathy.  Cerebellar testing, negative       Assessment & Plan:  Benign positional vertigo.  Plan reassured return p.r.n.

## 2011-03-13 ENCOUNTER — Telehealth: Payer: Self-pay | Admitting: Family Medicine

## 2011-03-13 NOTE — Telephone Encounter (Signed)
We will get his chest x-ray, the same day.  He comes in

## 2011-03-13 NOTE — Telephone Encounter (Signed)
Pt would like to have chest xray prior to cpx 2013 due to cigar smoker.

## 2011-03-15 NOTE — Telephone Encounter (Signed)
patient  Is aware 

## 2011-04-06 ENCOUNTER — Other Ambulatory Visit (INDEPENDENT_AMBULATORY_CARE_PROVIDER_SITE_OTHER): Payer: BC Managed Care – PPO

## 2011-04-06 DIAGNOSIS — Z0000 Encounter for general adult medical examination without abnormal findings: Secondary | ICD-10-CM

## 2011-04-06 LAB — CBC WITH DIFFERENTIAL/PLATELET
Basophils Relative: 0.4 % (ref 0.0–3.0)
Eosinophils Relative: 2 % (ref 0.0–5.0)
HCT: 47.8 % (ref 39.0–52.0)
Hemoglobin: 16.1 g/dL (ref 13.0–17.0)
Lymphs Abs: 1.4 10*3/uL (ref 0.7–4.0)
Monocytes Relative: 8.8 % (ref 3.0–12.0)
Neutro Abs: 3.7 10*3/uL (ref 1.4–7.7)
RBC: 4.74 Mil/uL (ref 4.22–5.81)
WBC: 5.7 10*3/uL (ref 4.5–10.5)

## 2011-04-06 LAB — POCT URINALYSIS DIPSTICK
Nitrite, UA: NEGATIVE
Spec Grav, UA: 1.02
Urobilinogen, UA: 1
pH, UA: 8.5

## 2011-04-06 LAB — LIPID PANEL
Cholesterol: 164 mg/dL (ref 0–200)
HDL: 49.2 mg/dL (ref >39.00)
LDL Cholesterol: 98 mg/dL (ref 0–99)
Total CHOL/HDL Ratio: 3
Triglycerides: 83 mg/dL (ref 0.0–149.0)
VLDL: 16.6 mg/dL (ref 0.0–40.0)

## 2011-04-06 LAB — HEPATIC FUNCTION PANEL
ALT: 18 U/L (ref 0–53)
Bilirubin, Direct: 0.1 mg/dL (ref 0.0–0.3)
Total Bilirubin: 0.7 mg/dL (ref 0.3–1.2)

## 2011-04-06 LAB — BASIC METABOLIC PANEL
GFR: 68.91 mL/min (ref >60.00)
Glucose, Bld: 98 mg/dL (ref 70–99)
Potassium: 5.9 mEq/L — ABNORMAL HIGH (ref 3.5–5.1)
Sodium: 144 mEq/L (ref 135–145)

## 2011-04-13 ENCOUNTER — Ambulatory Visit (INDEPENDENT_AMBULATORY_CARE_PROVIDER_SITE_OTHER): Payer: BC Managed Care – PPO | Admitting: Family Medicine

## 2011-04-13 ENCOUNTER — Encounter: Payer: Self-pay | Admitting: Family Medicine

## 2011-04-13 VITALS — BP 120/78 | Temp 98.7°F | Ht 70.5 in | Wt 190.0 lb

## 2011-04-13 DIAGNOSIS — G252 Other specified forms of tremor: Secondary | ICD-10-CM

## 2011-04-13 DIAGNOSIS — J309 Allergic rhinitis, unspecified: Secondary | ICD-10-CM

## 2011-04-13 DIAGNOSIS — Z0000 Encounter for general adult medical examination without abnormal findings: Secondary | ICD-10-CM

## 2011-04-13 DIAGNOSIS — E785 Hyperlipidemia, unspecified: Secondary | ICD-10-CM

## 2011-04-13 DIAGNOSIS — Z87891 Personal history of nicotine dependence: Secondary | ICD-10-CM

## 2011-04-13 DIAGNOSIS — R3129 Other microscopic hematuria: Secondary | ICD-10-CM

## 2011-04-13 DIAGNOSIS — G250 Essential tremor: Secondary | ICD-10-CM

## 2011-04-13 MED ORDER — SIMVASTATIN 10 MG PO TABS: 10.0000 mg | ORAL_TABLET | Freq: Every day | ORAL | Status: DC

## 2011-04-13 NOTE — Progress Notes (Signed)
  Subjective:    Patient ID: Dakota Barker, male    DOB: 04/07/1956, 55 y.o.   MRN: [id number]  HPI G.  is a 55 year old, married man smoker........ Cigars.......Marland KitchenComes in today for annual physical examination because of a history of allergic rhinitis anxiety and hyperlipidemia and essential tremor and tobacco abuse.  He has allergic rhinitis, for which he takes Zyrtec 10 mg nightly  He takes Valium p.r.n. For anxiety advised he began working out at the gym instead of taking Valium.  He takes Zocor 10 mg nightly along with an aspirin tablet for hyper lipidemia.  Lipids are at goal.  History of microscopic hematuria asymptomatic.  Urine unchanged.  Trace but intact.  Red cells.  He's had a history of tobacco abuse, smoking one or two cigars a day.  Again, advised to stop.  Patient requesting chest x-ray   Review of Systems  Constitutional: Negative.   HENT: Negative.   Eyes: Negative.   Respiratory: Negative.   Cardiovascular: Negative.   Gastrointestinal: Negative.   Genitourinary: Negative.   Musculoskeletal: Negative.   Skin: Negative.   Neurological: Negative.   Hematological: Negative.   Psychiatric/Behavioral: Negative.        Objective:   Physical Exam  Constitutional: He is oriented to person, place, and time. He appears well-developed and well-nourished.  HENT:  Head: Normocephalic and atraumatic.  Right Ear: External ear normal.  Left Ear: External ear normal.  Nose: Nose normal.  Mouth/Throat: Oropharynx is clear and moist.  Eyes: Conjunctivae and EOM are normal. Pupils are equal, round, and reactive to light.  Neck: Normal range of motion. Neck supple. No JVD present. No tracheal deviation present. No thyromegaly present.  Cardiovascular: Normal rate, regular rhythm, normal heart sounds and intact distal pulses.  Exam reveals no gallop and no friction rub.   No murmur heard. Pulmonary/Chest: Effort normal and breath sounds normal. No stridor. No respiratory  distress. He has no wheezes. He has no rales. He exhibits no tenderness.  Abdominal: Soft. Bowel sounds are normal. He exhibits no distension and no mass. There is no tenderness. There is no rebound and no guarding.  Genitourinary: Rectum normal, prostate normal and penis normal. Guaiac negative stool. No penile tenderness.  Musculoskeletal: Normal range of motion. He exhibits no edema and no tenderness.  Lymphadenopathy:    He has no cervical adenopathy.  Neurological: He is alert and oriented to person, place, and time. He has normal reflexes. No cranial nerve deficit. He exhibits normal muscle tone.  Skin: Skin is warm and dry. No rash noted. No erythema. No pallor.  Psychiatric: He has a normal mood and affect. His behavior is normal. Judgment and thought content normal.          Assessment & Plan:  Healthy male.  Allergic rhinitis.  Continue Zyrtec 10 mg daily.  Mild anxiety related to job stress and home situation.  We recommend exercise program.  Hyperlipidemia.  Continue Zocor and aspirin tablet.  Tobacco abuse again, advised to stop smoking.  Chest x-ray, per patient request

## 2011-04-13 NOTE — Patient Instructions (Signed)
Continue your current medications.  Again, and consider discontinuing the [place] cigars !!!!!!!!!!!!!!!!  Go to the main office across from [facility] for your chest x-ray.  Because of the stress in y  Life...............  I would recommend you begin an exercise program 30 minutes daily.  Return in one year or sooner if any problem

## 2011-04-19 ENCOUNTER — Ambulatory Visit (INDEPENDENT_AMBULATORY_CARE_PROVIDER_SITE_OTHER)
Admission: RE | Admit: 2011-04-19 | Discharge: 2011-04-19 | Disposition: A | Discharge status: Home / Self Care | Payer: BC Managed Care – PPO | Source: Ambulatory Visit | Attending: Family Medicine | Admitting: Family Medicine

## 2011-04-19 DIAGNOSIS — Z87891 Personal history of nicotine dependence: Secondary | ICD-10-CM

## 2011-04-19 DIAGNOSIS — Z0000 Encounter for general adult medical examination without abnormal findings: Secondary | ICD-10-CM

## 2012-03-12 ENCOUNTER — Telehealth: Payer: Self-pay | Admitting: Family Medicine

## 2012-03-12 NOTE — Telephone Encounter (Addendum)
Scheduled CPX for pt. 04/22/12. Pt is aware.

## 2012-03-12 NOTE — Telephone Encounter (Signed)
Pt needs CPX done by Dec. 31 per job requirement. His last was 12./7. Could we schedule him sometime in Dec for that CPX.?  Also, pt. states he would like to schedule his annual chest xray too.

## 2012-03-12 NOTE — Telephone Encounter (Signed)
Okay to work in

## 2012-04-11 ENCOUNTER — Other Ambulatory Visit (INDEPENDENT_AMBULATORY_CARE_PROVIDER_SITE_OTHER): Payer: BC Managed Care – PPO

## 2012-04-11 DIAGNOSIS — Z0000 Encounter for general adult medical examination without abnormal findings: Secondary | ICD-10-CM

## 2012-04-11 LAB — CBC WITH DIFFERENTIAL/PLATELET
Basophils Relative: 0.3 % (ref 0.0–3.0)
Eosinophils Absolute: 0.1 10*3/uL (ref 0.0–0.7)
Eosinophils Relative: 2.9 % (ref 0.0–5.0)
Hemoglobin: 15.4 g/dL (ref 13.0–17.0)
Lymphocytes Relative: 32.7 % (ref 12.0–46.0)
MCHC: 33.6 g/dL (ref 30.0–36.0)
MCV: 96.7 fl (ref 78.0–100.0)
Neutro Abs: 2.8 10*3/uL (ref 1.4–7.7)
Neutrophils Relative %: 55 % (ref 43.0–77.0)
RBC: 4.73 Mil/uL (ref 4.22–5.81)
WBC: 5.1 10*3/uL (ref 4.5–10.5)

## 2012-04-11 LAB — POCT URINALYSIS DIPSTICK
Bilirubin, UA: NEGATIVE
Ketones, UA: NEGATIVE
Protein, UA: NEGATIVE
Spec Grav, UA: 1.02
pH, UA: 7

## 2012-04-11 LAB — LIPID PANEL
HDL: 36.4 mg/dL — ABNORMAL LOW (ref >39.00)
LDL Cholesterol: 103 mg/dL — ABNORMAL HIGH (ref 0–99)
Total CHOL/HDL Ratio: 4
VLDL: 22.8 mg/dL (ref 0.0–40.0)

## 2012-04-11 LAB — BASIC METABOLIC PANEL
CO2: 29 mEq/L (ref 19–32)
Chloride: 106 mEq/L (ref 96–112)
Sodium: 141 mEq/L (ref 135–145)

## 2012-04-11 LAB — HEPATIC FUNCTION PANEL
ALT: 23 U/L (ref 0–53)
Albumin: 4 g/dL (ref 3.5–5.2)
Alkaline Phosphatase: 58 U/L (ref 39–117)
Bilirubin, Direct: 0.1 mg/dL (ref 0.0–0.3)
Total Protein: 6.7 g/dL (ref 6.0–8.3)

## 2012-04-16 ENCOUNTER — Other Ambulatory Visit: Payer: Self-pay | Admitting: Family Medicine

## 2012-04-22 ENCOUNTER — Ambulatory Visit (INDEPENDENT_AMBULATORY_CARE_PROVIDER_SITE_OTHER): Payer: BC Managed Care – PPO | Admitting: Family Medicine

## 2012-04-22 ENCOUNTER — Encounter: Payer: Self-pay | Admitting: Family Medicine

## 2012-04-22 VITALS — BP 130/80 | Temp 98.0°F | Ht 70.5 in | Wt 221.0 lb

## 2012-04-22 DIAGNOSIS — Z23 Encounter for immunization: Secondary | ICD-10-CM

## 2012-04-22 DIAGNOSIS — E785 Hyperlipidemia, unspecified: Secondary | ICD-10-CM

## 2012-04-22 DIAGNOSIS — G250 Essential tremor: Secondary | ICD-10-CM

## 2012-04-22 DIAGNOSIS — J309 Allergic rhinitis, unspecified: Secondary | ICD-10-CM

## 2012-04-22 DIAGNOSIS — F17200 Nicotine dependence, unspecified, uncomplicated: Secondary | ICD-10-CM

## 2012-04-22 DIAGNOSIS — R3129 Other microscopic hematuria: Secondary | ICD-10-CM

## 2012-04-22 DIAGNOSIS — Z87891 Personal history of nicotine dependence: Secondary | ICD-10-CM

## 2012-04-22 DIAGNOSIS — Z720 Tobacco use: Secondary | ICD-10-CM

## 2012-04-22 NOTE — Patient Instructions (Signed)
Continue your other medications  At the Viagra as outlined  Return in one year sooner if any problems

## 2012-04-22 NOTE — Progress Notes (Signed)
  Subjective:    Patient ID: Dakota Barker, male    DOB: [birth year], 56 y.o.   MRN: [id number]  HPI Dakota Barker is a delightful 56 year old married male smoker,,,,,,, a couple of cigars a week,,,,, who comes in today for general physical examination  He takes Zyrtec at bedtime for allergic rhinitis, 5 mg of Valium before flying on a plane, 10 mg of Zocor and aspirin tablet daily for hyperlipidemia. He has a benign tremor that is asymptomatic and has not gotten worst  He has episodes where he feels like his heart races he coughs and it stops at. He also would like to talk about Viagra. He also has episodes where his eyelids twitch.  Review of systems otherwise negative  Social history there is a lot of stress with work and they have a son and is going to the [place] but has a history of depression and his depression is getting worse. He's coming home for reevaluation by psychiatry   Review of Systems  Constitutional: Negative.   HENT: Negative.   Eyes: Negative.   Respiratory: Negative.   Cardiovascular: Negative.   Gastrointestinal: Negative.   Genitourinary: Negative.   Musculoskeletal: Negative.   Skin: Negative.   Neurological: Negative.   Hematological: Negative.   Psychiatric/Behavioral: Negative.        Objective:   Physical Exam  Constitutional: He is oriented to person, place, and time. He appears well-developed and well-nourished.  HENT:  Head: Normocephalic and atraumatic.  Right Ear: External ear normal.  Left Ear: External ear normal.  Nose: Nose normal.  Mouth/Throat: Oropharynx is clear and moist.  Eyes: Conjunctivae normal and EOM are normal. Pupils are equal, round, and reactive to light.  Neck: Normal range of motion. Neck supple. No JVD present. No tracheal deviation present. No thyromegaly present.  Cardiovascular: Normal rate, regular rhythm, normal heart sounds and intact distal pulses.  Exam reveals no gallop and no friction rub.   No murmur  heard. Pulmonary/Chest: Effort normal and breath sounds normal. No stridor. No respiratory distress. He has no wheezes. He has no rales. He exhibits no tenderness.  Abdominal: Soft. Bowel sounds are normal. He exhibits no distension and no mass. There is no tenderness. There is no rebound and no guarding.  Genitourinary: Rectum normal, prostate normal and penis normal. Guaiac negative stool. No penile tenderness.  Musculoskeletal: Normal range of motion. He exhibits no edema and no tenderness.  Lymphadenopathy:    He has no cervical adenopathy.  Neurological: He is alert and oriented to person, place, and time. He has normal reflexes. No cranial nerve deficit. He exhibits normal muscle tone.  Skin: Skin is warm and dry. No rash noted. No erythema. No pallor.  Psychiatric: He has a normal mood and affect. His behavior is normal. Judgment and thought content normal.          Assessment & Plan:  Healthy male  Allergic rhinitis continue Zyrtec  Fever of flying continue Valium 5 mg before her plane trip  Hyperlipidemia continue Zocor and aspirin  Episodes of rapid heart rate resolved by coughing observe  Erectile dysfunction begin Viagra  Fasciculations of upper eyelids otherwise asymptomatic observe  Benign tremor observe

## 2012-05-07 ENCOUNTER — Encounter: Payer: Self-pay | Admitting: Family Medicine

## 2012-05-07 ENCOUNTER — Ambulatory Visit (INDEPENDENT_AMBULATORY_CARE_PROVIDER_SITE_OTHER)
Admission: RE | Admit: 2012-05-07 | Discharge: 2012-05-07 | Disposition: A | Discharge status: Home / Self Care | Payer: BC Managed Care – PPO | Source: Ambulatory Visit | Attending: Family Medicine | Admitting: Family Medicine

## 2012-05-07 DIAGNOSIS — Z720 Tobacco use: Secondary | ICD-10-CM

## 2012-05-07 DIAGNOSIS — F17200 Nicotine dependence, unspecified, uncomplicated: Secondary | ICD-10-CM

## 2012-05-28 ENCOUNTER — Telehealth: Payer: Self-pay | Admitting: Family Medicine

## 2012-05-28 NOTE — Telephone Encounter (Signed)
Patient wants the results from his chest xray. Please call w/results.

## 2012-05-28 NOTE — Telephone Encounter (Signed)
Dakota Barker please call chest x-ray normal

## 2012-05-28 NOTE — Telephone Encounter (Signed)
Patient is aware of chest x-ray result. 

## 2013-02-25 ENCOUNTER — Other Ambulatory Visit (INDEPENDENT_AMBULATORY_CARE_PROVIDER_SITE_OTHER): Payer: BC Managed Care – PPO

## 2013-02-25 DIAGNOSIS — Z0000 Encounter for general adult medical examination without abnormal findings: Secondary | ICD-10-CM

## 2013-02-25 LAB — POCT URINALYSIS DIPSTICK
Blood, UA: NEGATIVE
Glucose, UA: NEGATIVE
Nitrite, UA: NEGATIVE
Urobilinogen, UA: 0.2
pH, UA: 6

## 2013-02-25 LAB — CBC WITH DIFFERENTIAL/PLATELET
Basophils Absolute: 0 10*3/uL (ref 0.0–0.1)
HCT: 45.5 % (ref 39.0–52.0)
Hemoglobin: 15.6 g/dL (ref 13.0–17.0)
Lymphs Abs: 2.1 10*3/uL (ref 0.7–4.0)
MCHC: 34.2 g/dL (ref 30.0–36.0)
MCV: 96.5 fl (ref 78.0–100.0)
Monocytes Relative: 9 % (ref 3.0–12.0)
Neutro Abs: 3.5 10*3/uL (ref 1.4–7.7)
RDW: 13.4 % (ref 11.5–14.6)

## 2013-02-25 LAB — BASIC METABOLIC PANEL
CO2: 29 mEq/L (ref 19–32)
Chloride: 105 mEq/L (ref 96–112)
GFR: 78.4 mL/min (ref >60.00)
Glucose, Bld: 99 mg/dL (ref 70–99)
Potassium: 5.4 mEq/L — ABNORMAL HIGH (ref 3.5–5.1)
Sodium: 141 mEq/L (ref 135–145)

## 2013-02-25 LAB — HEPATIC FUNCTION PANEL
AST: 28 U/L (ref 0–37)
Albumin: 4 g/dL (ref 3.5–5.2)

## 2013-02-25 LAB — LIPID PANEL
Cholesterol: 166 mg/dL (ref 0–200)
LDL Cholesterol: 103 mg/dL — ABNORMAL HIGH (ref 0–99)
Total CHOL/HDL Ratio: 4

## 2013-02-25 LAB — TSH: TSH: 1.54 u[IU]/mL (ref 0.35–5.50)

## 2013-03-04 ENCOUNTER — Ambulatory Visit (INDEPENDENT_AMBULATORY_CARE_PROVIDER_SITE_OTHER): Payer: BC Managed Care – PPO | Admitting: Family Medicine

## 2013-03-04 ENCOUNTER — Encounter: Payer: Self-pay | Admitting: Family Medicine

## 2013-03-04 VITALS — BP 120/84 | Temp 97.7°F | Ht 70.25 in | Wt 218.0 lb

## 2013-03-04 DIAGNOSIS — Z23 Encounter for immunization: Secondary | ICD-10-CM

## 2013-03-04 DIAGNOSIS — E785 Hyperlipidemia, unspecified: Secondary | ICD-10-CM

## 2013-03-04 DIAGNOSIS — J309 Allergic rhinitis, unspecified: Secondary | ICD-10-CM

## 2013-03-04 DIAGNOSIS — R3129 Other microscopic hematuria: Secondary | ICD-10-CM

## 2013-03-04 DIAGNOSIS — G250 Essential tremor: Secondary | ICD-10-CM

## 2013-03-04 DIAGNOSIS — N529 Male erectile dysfunction, unspecified: Secondary | ICD-10-CM

## 2013-03-04 DIAGNOSIS — Z87891 Personal history of nicotine dependence: Secondary | ICD-10-CM

## 2013-03-04 MED ORDER — SIMVASTATIN 10 MG PO TABS: ORAL_TABLET | ORAL | Status: DC

## 2013-03-04 MED ORDER — DIAZEPAM 5 MG PO TABS: 5.0000 mg | ORAL_TABLET | ORAL | Status: DC | PRN

## 2013-03-04 MED ORDER — SILDENAFIL CITRATE 50 MG PO TABS: 50.0000 mg | ORAL_TABLET | ORAL | Status: DC | PRN

## 2013-03-04 NOTE — Addendum Note (Signed)
Addended by: Kern Reap B on: 03/04/2013 05:21 PM   Modules accepted: Orders

## 2013-03-04 NOTE — Progress Notes (Signed)
  Subjective:    Patient ID: Dakota Barker, male    DOB: Mar 19, 1956, 57 y.o.   MRN: [id number]  HPI Dakota Barker is a 57 year old married male smoker,,,,,,,,,, one cigar a day,,,,,,,,, who comes in today for his annual physical examination  He takes an aspirin tablet and simvastatin for hyperlipidemia lipids are at goal with a total cholesterol 166 HDL 45 LDL 103  He has a benign tremor no current therapy  He takes Zyrtec 10 mg each bedtime when necessary for allergic rhinitis and Valium 5 mg when necessary when he travels.  He gets routine eye care, dental care, colonoscopy in his early 85s normal, vaccinations up-to-date   Review of Systems  Constitutional: Negative.   HENT: Negative.   Eyes: Negative.   Respiratory: Negative.   Cardiovascular: Negative.   Gastrointestinal: Negative.   Endocrine: Negative.   Genitourinary: Negative.   Musculoskeletal: Negative.   Skin: Negative.   Allergic/Immunologic: Negative.   Neurological: Negative.   Hematological: Negative.   Psychiatric/Behavioral: Negative.        Objective:   Physical Exam  Nursing note and vitals reviewed. Constitutional: He is oriented to person, place, and time. He appears well-developed and well-nourished.  HENT:  Head: Normocephalic and atraumatic.  Right Ear: External ear normal.  Left Ear: External ear normal.  Nose: Nose normal.  Mouth/Throat: Oropharynx is clear and moist.  Eyes: Conjunctivae and EOM are normal. Pupils are equal, round, and reactive to light.  Neck: Normal range of motion. Neck supple. No JVD present. No tracheal deviation present. No thyromegaly present.  Cardiovascular: Normal rate, regular rhythm, normal heart sounds and intact distal pulses.  Exam reveals no gallop and no friction rub.   No murmur heard. No carotid or bruits peripheral pulses 2+ and symmetrical  Pulmonary/Chest: Effort normal and breath sounds normal. No stridor. No respiratory distress. He has no wheezes. He has  no rales. He exhibits no tenderness.  Abdominal: Soft. Bowel sounds are normal. He exhibits no distension and no mass. There is no tenderness. There is no rebound and no guarding.  Genitourinary: Rectum normal, prostate normal and penis normal. Guaiac negative stool. No penile tenderness.  Musculoskeletal: Normal range of motion. He exhibits no edema and no tenderness.  Lymphadenopathy:    He has no cervical adenopathy.  Neurological: He is alert and oriented to person, place, and time. He has normal reflexes. No cranial nerve deficit. He exhibits normal muscle tone.  Skin: Skin is warm and dry. No rash noted. No erythema. No pallor.  Psychiatric: He has a normal mood and affect. His behavior is normal. Judgment and thought content normal.          Assessment & Plan:  Healthy male  History of hyperlipidemia continue simvastatin and aspirin  Allergic rhinitis Zyrtec each bedtime when necessary  Anxiety related to travel Valium 5 mg when necessary when traveling and flying  Mild ED Viagra 50 mg

## 2013-03-04 NOTE — Patient Instructions (Signed)
Continue your current medications  Viagra 50 mg......... one half tab 1-2 hours prior to sex  [email or web address]  Return in one year sooner if any problems

## 2013-03-06 ENCOUNTER — Ambulatory Visit (INDEPENDENT_AMBULATORY_CARE_PROVIDER_SITE_OTHER)
Admission: RE | Admit: 2013-03-06 | Discharge: 2013-03-06 | Disposition: A | Discharge status: Home / Self Care | Payer: BC Managed Care – PPO | Source: Ambulatory Visit | Attending: Family Medicine | Admitting: Family Medicine

## 2013-03-06 DIAGNOSIS — Z87891 Personal history of nicotine dependence: Secondary | ICD-10-CM

## 2013-04-08 ENCOUNTER — Other Ambulatory Visit: Payer: Self-pay | Admitting: *Deleted

## 2013-04-08 MED ORDER — TADALAFIL 20 MG PO TABS: 20.0000 mg | ORAL_TABLET | Freq: Every day | ORAL | Status: DC | PRN

## 2013-05-05 ENCOUNTER — Encounter: Payer: Self-pay | Admitting: *Deleted

## 2013-05-13 ENCOUNTER — Ambulatory Visit: Payer: BC Managed Care – PPO | Admitting: Cardiology

## 2013-06-05 ENCOUNTER — Ambulatory Visit (INDEPENDENT_AMBULATORY_CARE_PROVIDER_SITE_OTHER): Payer: BC Managed Care – PPO | Admitting: Cardiology

## 2013-06-05 ENCOUNTER — Encounter: Payer: Self-pay | Admitting: Cardiology

## 2013-06-05 VITALS — BP 108/70 | HR 60 | Ht 70.0 in | Wt 214.8 lb

## 2013-06-05 DIAGNOSIS — R002 Palpitations: Secondary | ICD-10-CM | POA: Insufficient documentation

## 2013-06-05 NOTE — Progress Notes (Signed)
Patient ID: Dakota Barker, male   DOB: 1956-03-11, 58 y.o.   MRN: [id number] PCP: Dr. Sherren Mocha  58 yo with history of hyperlipidemia presents for evaluation of palpitations.  From 10/14-11/14, patient had frequent runs of what felt like regular tachycardia.  The runs would last for a few seconds, then the patient would cough and they would tend to resolve.  They were happening 1-2 times a day. He would often notice them at night when he was in bed.  He was under a fair amount of stress during this period.  The symptoms have significantly resolved since that time.  He has had only 1-2 episodes over the last month.  Prior to 10/14, he had had infrequent runs of tachypalpitations.  He tends to drink 2 cups of coffee in the morning and 1 Pepsi in the afternoon.  TSH was normal in 10/14.  He did not get lightheaded with the palpitations.  He has never passed out.    Patient has good exercise tolerance.  No exertional dyspnea or chest pain.  BP is good.  Lipids in 10/14 were reasonable.  He does smoke 1 cigar/day generally.   ECG (10/14): NSR, normal  Labs (10/14): TSH normal, K 5.4, creatinine 1.0, LDL 103, HDL 45  PMH: 1. Hyperlipidemia 2. Erectile dysfunction 3. Benign tremor 4. Allergic rhinitis 5. ETT 2008 was normal.  6. Palpitations.  SH: Married, 3 kids, lives in [place] (originally from [place]), Restaurant manager, fast food, smokes 1 cigar a day, occasional ETOH.   FH: Grandfather with MI at 8; Father with CHF, died at 80.   ROS: All systems reviewed and negative except as per HPI.   Current Outpatient Prescriptions  Medication Sig Dispense Refill  . cetirizine (ZYRTEC) 10 MG chewable tablet Chew 10 mg by mouth as needed.       . diazepam (VALIUM) 5 MG tablet Take 1 tablet (5 mg total) by mouth as needed. For travel  30 tablet  5  . fish oil-omega-3 fatty acids 1000 MG capsule Take 2 g by mouth daily.        . Multiple Vitamin (MULTIVITAMIN) tablet Take 1 tablet by mouth daily.        .  simvastatin (ZOCOR) 10 MG tablet TAKE ONE TABLET AT BEDTIME.  30 tablet  3  . tadalafil (CIALIS) 20 MG tablet Take 1 tablet (20 mg total) by mouth daily as needed for erectile dysfunction.  5 tablet  11  . aspirin EC 81 MG tablet Take 1 tablet (81 mg total) by mouth daily.       No current facility-administered medications for this visit.    BP 108/70  Pulse 60  Ht 5\' 10"  (1.778 m)  Wt 97.433 kg (214 lb 12.8 oz)  BMI 30.82 kg/m2 General: NAD Neck: No JVD, no thyromegaly or thyroid nodule.  Lungs: Clear to auscultation bilaterally with normal respiratory effort. CV: Nondisplaced PMI.  Heart regular S1/S2, no S3/S4, no murmur.  No peripheral edema.  No carotid bruit.  Normal pedal pulses.  Abdomen: Soft, nontender, no hepatosplenomegaly, no distention.  Skin: Intact without lesions or rashes.  Neurologic: Alert and oriented x 3.  Psych: Normal affect. Extremities: No clubbing or cyanosis.  HEENT: Normal.   Assessment/Plan: 1. Tachypalpitations: Patient was having short runs of possible SVT (by symptoms) in 10/14-11/14.  These have mostly stopped occurring.  He did not get lightheaded with the runs. They were related to a stressful period.  TSH was normal.  - Would work  on cutting back a bit on caffeine.  - I will have him wear a 48 hour holter to see if I can document SVT but I think that at this point, the yield will probably be low.  - If symptoms return and are bothersome, a beta blocker would be reasonable.  Beta blocker could help with his tremor as well.  - Advised to get regular exercise and reasonable sleep.  2. Cardiac risk: No ischemic symptoms.  Good lipids on current simvastatin in 10/14.  He is on aspirin.  Would be reasonable to decrease the dose to 81 mg daily.   Loralie Champagne 06/05/2013

## 2013-06-05 NOTE — Patient Instructions (Signed)
Decrease aspirin to 81mg  daily   Your physician has recommended that you wear a holter monitor. Holter monitors are medical devices that record the heart's electrical activity. Doctors most often use these monitors to diagnose arrhythmias. Arrhythmias are problems with the speed or rhythm of the heartbeat. The monitor is a small, portable device. You can wear one while you do your normal daily activities. This is usually used to diagnose what is causing palpitations/syncope (passing out). 52 Hour monitor   Your physician recommends that you schedule a follow-up appointment as needed with Dr Aundra Dubin.

## 2013-06-13 ENCOUNTER — Encounter: Payer: Self-pay | Admitting: Radiology

## 2013-06-13 ENCOUNTER — Encounter (INDEPENDENT_AMBULATORY_CARE_PROVIDER_SITE_OTHER): Payer: BC Managed Care – PPO

## 2013-06-13 DIAGNOSIS — R002 Palpitations: Secondary | ICD-10-CM

## 2013-06-13 NOTE — Progress Notes (Signed)
Patient ID: Dakota Barker, male   DOB: 1955-10-24, 58 y.o.   MRN: [id number] E Cardio 48hr holter monitor applied

## 2013-06-17 ENCOUNTER — Telehealth: Payer: Self-pay | Admitting: *Deleted

## 2013-06-17 NOTE — Telephone Encounter (Signed)
Dr Aundra Dubin reviewed monitor done 06/13/13--few PACs, no long runs SVT. No changes to treatment. Pt advised.

## 2013-07-18 ENCOUNTER — Other Ambulatory Visit: Payer: Self-pay | Admitting: Family Medicine

## 2014-02-18 ENCOUNTER — Telehealth: Payer: Self-pay | Admitting: Family Medicine

## 2014-02-18 DIAGNOSIS — Z87891 Personal history of nicotine dependence: Secondary | ICD-10-CM

## 2014-02-18 NOTE — Telephone Encounter (Signed)
Pt states he gets an xray on his lungs every yr due to being a cigar smoker.  Pt would like you to put the order in so he can go next week prior to his cpe.

## 2014-02-19 NOTE — Telephone Encounter (Signed)
Order palaced

## 2014-02-25 ENCOUNTER — Ambulatory Visit (INDEPENDENT_AMBULATORY_CARE_PROVIDER_SITE_OTHER)
Admission: RE | Admit: 2014-02-25 | Discharge: 2014-02-25 | Disposition: A | Discharge status: Home / Self Care | Payer: BC Managed Care – PPO | Source: Ambulatory Visit | Attending: Family Medicine | Admitting: Family Medicine

## 2014-02-25 DIAGNOSIS — Z87891 Personal history of nicotine dependence: Secondary | ICD-10-CM

## 2014-02-26 ENCOUNTER — Other Ambulatory Visit (INDEPENDENT_AMBULATORY_CARE_PROVIDER_SITE_OTHER): Payer: BC Managed Care – PPO

## 2014-02-26 DIAGNOSIS — Z0000 Encounter for general adult medical examination without abnormal findings: Secondary | ICD-10-CM

## 2014-02-26 DIAGNOSIS — Z833 Family history of diabetes mellitus: Secondary | ICD-10-CM

## 2014-02-26 LAB — LIPID PANEL
CHOLESTEROL: 175 mg/dL (ref 0–200)
HDL: 41.4 mg/dL (ref >39.00)
LDL Cholesterol: 109 mg/dL — ABNORMAL HIGH (ref 0–99)
NonHDL: 133.6
TRIGLYCERIDES: 121 mg/dL (ref 0.0–149.0)
Total CHOL/HDL Ratio: 4
VLDL: 24.2 mg/dL (ref 0.0–40.0)

## 2014-02-26 LAB — CBC WITH DIFFERENTIAL/PLATELET
BASOS ABS: 0 10*3/uL (ref 0.0–0.1)
Basophils Relative: 0.4 % (ref 0.0–3.0)
EOS ABS: 0.2 10*3/uL (ref 0.0–0.7)
Eosinophils Relative: 4.2 % (ref 0.0–5.0)
HCT: 48 % (ref 39.0–52.0)
Hemoglobin: 16 g/dL (ref 13.0–17.0)
LYMPHS PCT: 33.1 % (ref 12.0–46.0)
Lymphs Abs: 1.9 10*3/uL (ref 0.7–4.0)
MCHC: 33.4 g/dL (ref 30.0–36.0)
MCV: 97.9 fl (ref 78.0–100.0)
MONOS PCT: 9 % (ref 3.0–12.0)
Monocytes Absolute: 0.5 10*3/uL (ref 0.1–1.0)
NEUTROS ABS: 3.1 10*3/uL (ref 1.4–7.7)
NEUTROS PCT: 53.3 % (ref 43.0–77.0)
PLATELETS: 282 10*3/uL (ref 150.0–400.0)
RBC: 4.91 Mil/uL (ref 4.22–5.81)
RDW: 13.1 % (ref 11.5–15.5)
WBC: 5.9 10*3/uL (ref 4.0–10.5)

## 2014-02-26 LAB — POCT URINALYSIS DIPSTICK
Bilirubin, UA: NEGATIVE
Glucose, UA: NEGATIVE
Ketones, UA: NEGATIVE
Leukocytes, UA: NEGATIVE
Nitrite, UA: NEGATIVE
Protein, UA: NEGATIVE
Spec Grav, UA: 1.015
Urobilinogen, UA: 0.2
pH, UA: 5.5

## 2014-02-26 LAB — BASIC METABOLIC PANEL WITH GFR
BUN: 23 mg/dL (ref 6–23)
CO2: 27 meq/L (ref 19–32)
Calcium: 9.6 mg/dL (ref 8.4–10.5)
Chloride: 106 meq/L (ref 96–112)
Creatinine, Ser: 1.1 mg/dL (ref 0.4–1.5)
GFR: 74.79 mL/min
Glucose, Bld: 88 mg/dL (ref 70–99)
Potassium: 4.9 meq/L (ref 3.5–5.1)
Sodium: 141 meq/L (ref 135–145)

## 2014-02-26 LAB — HEPATIC FUNCTION PANEL
ALT: 22 U/L (ref 0–53)
AST: 25 U/L (ref 0–37)
Albumin: 3.8 g/dL (ref 3.5–5.2)
Alkaline Phosphatase: 58 U/L (ref 39–117)
Bilirubin, Direct: 0.1 mg/dL (ref 0.0–0.3)
Total Bilirubin: 0.8 mg/dL (ref 0.2–1.2)
Total Protein: 7.4 g/dL (ref 6.0–8.3)

## 2014-02-26 LAB — TSH: TSH: 2.22 u[IU]/mL (ref 0.35–4.50)

## 2014-02-26 LAB — HEMOGLOBIN A1C: HEMOGLOBIN A1C: 5.5 % (ref 4.6–6.5)

## 2014-02-26 LAB — PSA: PSA: 0.75 ng/mL (ref 0.10–4.00)

## 2014-03-05 ENCOUNTER — Ambulatory Visit (INDEPENDENT_AMBULATORY_CARE_PROVIDER_SITE_OTHER): Payer: BC Managed Care – PPO | Admitting: Family Medicine

## 2014-03-05 ENCOUNTER — Encounter: Payer: Self-pay | Admitting: Family Medicine

## 2014-03-05 VITALS — BP 120/80 | Temp 98.1°F | Ht 70.5 in | Wt 218.0 lb

## 2014-03-05 DIAGNOSIS — R3129 Other microscopic hematuria: Secondary | ICD-10-CM

## 2014-03-05 DIAGNOSIS — R42 Dizziness and giddiness: Secondary | ICD-10-CM

## 2014-03-05 DIAGNOSIS — E785 Hyperlipidemia, unspecified: Secondary | ICD-10-CM

## 2014-03-05 DIAGNOSIS — Z0000 Encounter for general adult medical examination without abnormal findings: Secondary | ICD-10-CM | POA: Insufficient documentation

## 2014-03-05 DIAGNOSIS — N529 Male erectile dysfunction, unspecified: Secondary | ICD-10-CM

## 2014-03-05 DIAGNOSIS — R002 Palpitations: Secondary | ICD-10-CM

## 2014-03-05 DIAGNOSIS — R312 Other microscopic hematuria: Secondary | ICD-10-CM

## 2014-03-05 MED ORDER — SIMVASTATIN 10 MG PO TABS: ORAL_TABLET | ORAL | Status: DC

## 2014-03-05 MED ORDER — TADALAFIL 20 MG PO TABS: 20.0000 mg | ORAL_TABLET | Freq: Every day | ORAL | Status: DC | PRN

## 2014-03-05 NOTE — Patient Instructions (Signed)
Continue current medication  Cialis 20 mg..... One half tab 1-2 hours prior to sex  * Exercise program walking 30 minutes daily  Avoid all caffeine and nicotine products........ they can trigger palpitations  Return in one year or sooner if any problems

## 2014-03-05 NOTE — Progress Notes (Signed)
Pre visit review using our clinic review tool, if applicable. No additional management support is needed unless otherwise documented below in the visit note. 

## 2014-03-05 NOTE — Progress Notes (Signed)
   Subjective:    Patient ID: Dakota Barker, male    DOB: October 09, 1955, 58 y.o.   MRN: [id number]  HPI Dakota Barker is a 58 year old married male nonsmoker who comes in today for evaluation because of underlying hyperlipidemia allergic rhinitis and erectile dysfunction  He takes simvastatin 10 mg daily and an aspirin tablet HDL cholesterol is 41 LDL 109 therefore continue current therapy  He would like a trial of Cialis 20 mg  He takes over-the-counter medication for allergic rhinitis and Valium 5 mg before traveling  He had frequent palpitations this past year we sent him for cardiac evaluation hold her monitor shows benign PVCs. Advised to stay away from all caffeine and nicotine products......... he does smoke an occasional cigar  Vaccinations updated by Apolonio Schneiders   Review of Systems  Constitutional: Negative.   HENT: Negative.   Eyes: Negative.   Respiratory: Negative.   Cardiovascular: Negative.   Gastrointestinal: Negative.   Endocrine: Negative.   Genitourinary: Negative.   Musculoskeletal: Negative.   Skin: Negative.   Allergic/Immunologic: Negative.   Neurological: Negative.   Hematological: Negative.   Psychiatric/Behavioral: Negative.        Objective:   Physical Exam  Nursing note and vitals reviewed. Constitutional: He is oriented to person, place, and time. He appears well-developed and well-nourished.  HENT:  Head: Normocephalic and atraumatic.  Right Ear: External ear normal.  Left Ear: External ear normal.  Nose: Nose normal.  Mouth/Throat: Oropharynx is clear and moist.  Eyes: Conjunctivae and EOM are normal. Pupils are equal, round, and reactive to light.  Neck: Normal range of motion. Neck supple. No JVD present. No tracheal deviation present. No thyromegaly present.  Cardiovascular: Normal rate, regular rhythm, normal heart sounds and intact distal pulses.  Exam reveals no gallop and no friction rub.   No murmur heard. Pulmonary/Chest: Effort normal and  breath sounds normal. No stridor. No respiratory distress. He has no wheezes. He has no rales. He exhibits no tenderness.  Abdominal: Soft. Bowel sounds are normal. He exhibits no distension and no mass. There is no tenderness. There is no rebound and no guarding.  Genitourinary: Rectum normal, prostate normal and penis normal. Guaiac negative stool. No penile tenderness.  Musculoskeletal: Normal range of motion. He exhibits no edema and no tenderness.  Lymphadenopathy:    He has no cervical adenopathy.  Neurological: He is alert and oriented to person, place, and time. He has normal reflexes. No cranial nerve deficit. He exhibits normal muscle tone.  Chronic benign tremor  Skin: Skin is warm and dry. No rash noted. No erythema. No pallor.  Psychiatric: He has a normal mood and affect. His behavior is normal. Judgment and thought content normal.          Assessment & Plan:  Healthy male  Hyperlipidemia continue Zocor and aspirin  Erectile dysfunction trial Cialis 20 mg  Benign tremortherapy at this time  History of microscopic hematuria unchanged

## 2014-03-06 ENCOUNTER — Telehealth: Payer: Self-pay | Admitting: Family Medicine

## 2014-03-06 NOTE — Telephone Encounter (Signed)
emmi mailed  °

## 2014-04-22 ENCOUNTER — Other Ambulatory Visit: Payer: Self-pay | Admitting: Dermatology

## 2014-04-23 ENCOUNTER — Other Ambulatory Visit: Payer: Self-pay | Admitting: Family Medicine

## 2014-11-03 ENCOUNTER — Ambulatory Visit (INDEPENDENT_AMBULATORY_CARE_PROVIDER_SITE_OTHER): Payer: BLUE CROSS/BLUE SHIELD | Admitting: Internal Medicine

## 2014-11-03 ENCOUNTER — Encounter: Payer: Self-pay | Admitting: Internal Medicine

## 2014-11-03 VITALS — BP 120/80 | HR 57 | Temp 97.9°F | Resp 20 | Ht 70.5 in | Wt 214.0 lb

## 2014-11-03 DIAGNOSIS — R0789 Other chest pain: Secondary | ICD-10-CM

## 2014-11-03 NOTE — Progress Notes (Signed)
Pre visit review using our clinic review tool, if applicable. No additional management support is needed unless otherwise documented below in the visit note. 

## 2014-11-03 NOTE — Patient Instructions (Signed)
Take Aleve 200 mg twice daily for pain or swelling  You  may move around, but avoid painful motions and activities.  Apply ice to the sore area for 15 to 20 minutes 3 or 4 times daily for the next two to 3 days after vigorous activities  Call or return to clinic prn if these symptoms worsen or fail to improve as anticipated.

## 2014-11-03 NOTE — Progress Notes (Signed)
   Subjective:    Patient ID: Dakota Barker, male    DOB: 05/09/1955, 59 y.o.   MRN: [id number]  HPI  59 year old patient who is seen today with a chief complaint of pain in the left axilla area.  He has been practicing golf after a long absence and has also been working out doing a number of exercises at his health club.  The past several days he has had tenderness and discomfort in the left upper lateral chest wall area  Past Medical History  Diagnosis Date  . Allergy   . ADHD (attention deficit hyperactivity disorder)   . Tremor     benign, essential  . Hematuria   . Hyperlipidemia     History   Social History  . Marital Status: Married    Spouse Name: N/A  . Number of Children: N/A  . Years of Education: N/A   Occupational History  . Not on file.   Social History Main Topics  . Smoking status: Current Some Day Smoker -- 2 years    Types: Cigars  . Smokeless tobacco: Never Used  . Alcohol Use: Yes  . Drug Use: No  . Sexual Activity: Not on file   Other Topics Concern  . Not on file   Social History Narrative    Past Surgical History  Procedure Laterality Date  . Knee surgery      right    Family History  Problem Relation Age of Onset  . Aneurysm Other     aortic  . Diabetes Other   . Hypertension Other   . Breast cancer Other   . Stroke Other     less 50 years    No Known Allergies  Current Outpatient Prescriptions on File Prior to Visit  Medication Sig Dispense Refill  . aspirin EC 81 MG tablet Take 1 tablet (81 mg total) by mouth daily.    . cetirizine (ZYRTEC) 10 MG chewable tablet Chew 10 mg by mouth as needed.     Marland Kitchen CIALIS 20 MG tablet TAKE 1 TABLET ONCE DAILY AS NEEDED FOR ERECTILE DYSFUNCTION. 5 tablet 5  . diazepam (VALIUM) 5 MG tablet Take 1 tablet (5 mg total) by mouth as needed. For travel 30 tablet 5  . fish oil-omega-3 fatty acids 1000 MG capsule Take 2 g by mouth daily.      . Multiple Vitamin (MULTIVITAMIN) tablet Take 1  tablet by mouth daily.      . simvastatin (ZOCOR) 10 MG tablet TAKE ONE TABLET AT BEDTIME. 100 tablet 4   No current facility-administered medications on file prior to visit.    BP 120/80 mmHg  Pulse 57  Temp(Src) 97.9 F (36.6 C) (Oral)  Resp 20  Ht 5' 10.5" (1.791 m)  Wt 214 lb (97.07 kg)  BMI 30.26 kg/m2  SpO2 98%     Review of Systems     Objective:   Physical Exam  Constitutional: He appears well-developed and well-nourished. No distress.  Pulmonary/Chest: Effort normal. No respiratory distress. He has no wheezes. He has no rales. He exhibits tenderness.  Left axilla was unremarkable.  No adenopathy Mild local tenderness around the left anterolateral chest wall          Assessment & Plan:   Left chest wall pain.  The patient minimizes his activities.  Anti-inflammatory medications recommended  He will report any new or worsening symptoms

## 2015-01-18 ENCOUNTER — Telehealth: Payer: Self-pay | Admitting: Family Medicine

## 2015-01-18 DIAGNOSIS — F17200 Nicotine dependence, unspecified, uncomplicated: Secondary | ICD-10-CM

## 2015-01-18 NOTE — Telephone Encounter (Signed)
Pt call to say that he gets xrays done before his physical and would like to know when he can have these done.

## 2015-01-20 NOTE — Telephone Encounter (Signed)
Xray ordered per Dr Sherren Mocha and patient is aware.

## 2015-01-27 ENCOUNTER — Ambulatory Visit (INDEPENDENT_AMBULATORY_CARE_PROVIDER_SITE_OTHER)
Admission: RE | Admit: 2015-01-27 | Discharge: 2015-01-27 | Disposition: A | Discharge status: Home / Self Care | Payer: BLUE CROSS/BLUE SHIELD | Source: Ambulatory Visit | Attending: Family Medicine | Admitting: Family Medicine

## 2015-01-27 DIAGNOSIS — Z720 Tobacco use: Secondary | ICD-10-CM | POA: Diagnosis not present

## 2015-01-27 DIAGNOSIS — F17200 Nicotine dependence, unspecified, uncomplicated: Secondary | ICD-10-CM

## 2015-01-28 ENCOUNTER — Other Ambulatory Visit: Payer: Self-pay | Admitting: Family Medicine

## 2015-02-01 ENCOUNTER — Encounter (HOSPITAL_COMMUNITY): Payer: Self-pay | Admitting: Emergency Medicine

## 2015-02-01 ENCOUNTER — Emergency Department (HOSPITAL_COMMUNITY)
Admission: EM | Admit: 2015-02-01 | Discharge: 2015-02-01 | Disposition: A | Discharge status: Home / Self Care | Payer: BLUE CROSS/BLUE SHIELD | Attending: Emergency Medicine | Admitting: Emergency Medicine

## 2015-02-01 ENCOUNTER — Telehealth: Payer: Self-pay | Admitting: Family Medicine

## 2015-02-01 DIAGNOSIS — R079 Chest pain, unspecified: Secondary | ICD-10-CM | POA: Diagnosis not present

## 2015-02-01 DIAGNOSIS — Z79899 Other long term (current) drug therapy: Secondary | ICD-10-CM | POA: Diagnosis not present

## 2015-02-01 DIAGNOSIS — Z7982 Long term (current) use of aspirin: Secondary | ICD-10-CM | POA: Insufficient documentation

## 2015-02-01 DIAGNOSIS — R0602 Shortness of breath: Secondary | ICD-10-CM | POA: Diagnosis present

## 2015-02-01 DIAGNOSIS — Z720 Tobacco use: Secondary | ICD-10-CM | POA: Insufficient documentation

## 2015-02-01 DIAGNOSIS — Z8659 Personal history of other mental and behavioral disorders: Secondary | ICD-10-CM | POA: Diagnosis not present

## 2015-02-01 DIAGNOSIS — E785 Hyperlipidemia, unspecified: Secondary | ICD-10-CM | POA: Insufficient documentation

## 2015-02-01 DIAGNOSIS — R091 Pleurisy: Secondary | ICD-10-CM

## 2015-02-01 LAB — I-STAT TROPONIN, ED: TROPONIN I, POC: 0 ng/mL (ref 0.00–0.08)

## 2015-02-01 LAB — CBC
HCT: 43.2 % (ref 39.0–52.0)
HEMOGLOBIN: 14.9 g/dL (ref 13.0–17.0)
MCH: 32.9 pg (ref 26.0–34.0)
MCHC: 34.5 g/dL (ref 30.0–36.0)
MCV: 95.4 fL (ref 78.0–100.0)
Platelets: 234 10*3/uL (ref 150–400)
RBC: 4.53 MIL/uL (ref 4.22–5.81)
RDW: 12.6 % (ref 11.5–15.5)
WBC: 6.2 10*3/uL (ref 4.0–10.5)

## 2015-02-01 LAB — BASIC METABOLIC PANEL
ANION GAP: 6 (ref 5–15)
BUN: 14 mg/dL (ref 6–20)
CALCIUM: 9.3 mg/dL (ref 8.9–10.3)
CHLORIDE: 106 mmol/L (ref 101–111)
CO2: 27 mmol/L (ref 22–32)
Creatinine, Ser: 1.04 mg/dL (ref 0.61–1.24)
GFR calc non Af Amer: >60 mL/min (ref >60)
Glucose, Bld: 100 mg/dL — ABNORMAL HIGH (ref 65–99)
POTASSIUM: 4.8 mmol/L (ref 3.5–5.1)
Sodium: 139 mmol/L (ref 135–145)

## 2015-02-01 MED ORDER — CYCLOBENZAPRINE HCL 10 MG PO TABS: 10.0000 mg | ORAL_TABLET | Freq: Two times a day (BID) | ORAL | Status: DC | PRN

## 2015-02-01 MED ORDER — HYDROCODONE-ACETAMINOPHEN 5-325 MG PO TABS
1.0000 | ORAL_TABLET | Freq: Once | ORAL | Status: DC
  Filled 2015-02-01: qty 1

## 2015-02-01 MED ORDER — HYDROCODONE-ACETAMINOPHEN 5-325 MG PO TABS: 2.0000 | ORAL_TABLET | ORAL | Status: DC | PRN

## 2015-02-01 MED ORDER — NAPROXEN 500 MG PO TABS: 500.0000 mg | ORAL_TABLET | Freq: Two times a day (BID) | ORAL | Status: DC

## 2015-02-01 MED ORDER — IBUPROFEN 800 MG PO TABS
800.0000 mg | ORAL_TABLET | Freq: Once | ORAL | Status: AC
  Administered 2015-02-01: 800 mg via ORAL
  Filled 2015-02-01: qty 1

## 2015-02-01 NOTE — ED Notes (Signed)
Pt did not want an EKG feels pain is pulled muscle from golf.

## 2015-02-01 NOTE — Discharge Instructions (Signed)
Pleurisy Follow-up with her primary care physician. Return for chest pain, shortness of breath, diaphoresis. Pleurisy is an inflammation and swelling of the lining of the lungs (pleura). Because of this inflammation, it hurts to breathe. It can be aggravated by coughing, laughing, or deep breathing. Pleurisy is often caused by an underlying infection or disease.  HOME CARE INSTRUCTIONS  Monitor your pleurisy for any changes. The following actions may help to alleviate any discomfort you are experiencing:  Medicine may help with pain. Only take over-the-counter or prescription medicines for pain, discomfort, or fever as directed by your health care provider.  Only take antibiotic medicine as directed. Make sure to finish it even if you start to feel better. SEEK MEDICAL CARE IF:   Your pain is not controlled with medicine or is increasing.  You have an increase in pus-like (purulent) secretions brought up with coughing. SEEK IMMEDIATE MEDICAL CARE IF:   You have blue or dark lips, fingernails, or toenails.  You are coughing up blood.  You have increased difficulty breathing.  You have continuing pain unrelieved by medicine or pain lasting more than 1 week.  You have pain that radiates into your neck, arms, or jaw.  You develop increased shortness of breath or wheezing.  You develop a fever, rash, vomiting, fainting, or other serious symptoms. MAKE SURE YOU:  Understand these instructions.   Will watch your condition.   Will get help right away if you are not doing well or get worse.  Document Released: 04/24/2005 Document Revised: 12/25/2012 Document Reviewed: 10/06/2012 [facility] Patient Information 2015 [place], [place]. This information is not intended to replace advice given to you by your health care provider. Make sure you discuss any questions you have with your health care provider.

## 2015-02-01 NOTE — Telephone Encounter (Signed)
Patient Name: Dakota Barker DOB: 11-07-1955 Initial Comment caller states he injured a chest muscle hitting golf balls 2 mo ago - he went out again and hit golf balls again on tuesday - he cant breathe or move his legs - now has a shooting pain in his chest areastates it is a constant ache and a shooting pain Nurse Assessment Nurse: Marcelline Deist, RN, Kermit Balo Date/Time ([place] Time): 02/01/2015 9:09:13 AM Confirm and document reason for call. If symptomatic, describe symptoms. ---Caller states he injured a chest muscle hitting golf balls 2 mo ago. He was seen, pulled muscle. He went out again and hit golf balls again on Tuesday. He can't breathe or move his legs without a shooting pain in his chest area. He states it is a constant ache and a shooting pain in the center to under arm on the left side. Has the patient traveled out of the country within the last 30 days? ---Not Applicable Does the patient require triage? ---Yes Related visit to physician within the last 2 weeks? ---No Does the PT have any chronic conditions? (i.e. diabetes, asthma, etc.) ---Yes List chronic conditions. ---cholesterol rx Guidelines Guideline Title Affirmed Question Affirmed Notes Chest Injury [1] Can't take a deep breath BUT [2] no respiratory distress Final Disposition User Go to ED Now (or PCP triage) Marcelline Deist, RN, Lynda Comments Caller states he can barely move, the pain shoots through the chest area & immobilized him. Strongly advised to be seen in the ER as they have the equipment to deal with this. Referrals [facility] - ED Disagree/Comply: Comply

## 2015-02-01 NOTE — ED Provider Notes (Signed)
CSN: [id number]     Arrival date & time 02/01/15  0935 History   First MD Initiated Contact with Patient 02/01/15 1115     Chief Complaint  Patient presents with  . Shortness of Breath   chest pain   (Consider location/radiation/quality/duration/timing/severity/associated sxs/prior Treatment) Patient is a 59 y.o. male presenting with shortness of breath. The history is provided by the patient. No language interpreter was used.  Shortness of Breath Associated symptoms: chest pain   Associated symptoms: no cough and no vomiting    Mr. Patsey Berthold is a 59 year old male with a history of hematuria, hyperlipidemia, and tremor who presents for left-sided chest pain that is worse with movement times one week. He states he had played golf in the past and had the same pain and was told that it was a pulled muscle. He was evaluated for this pain 1 week ago after playing golf again and was told it was a pulled muscle once again and was given ibuprofen at that time. He states it began feeling better but then returned. He states that it is a reproducible pain with movement especially when moving from a lying to sitting or standing position. He refused an EKG in triage stating that it was muscle related and not cardiac in nature. He smokes several cigars a day but no cigarettes. He takes aspirin daily. He denies any fever, chills, shortness of breath, cough, abdominal pain, nausea, vomiting, diarrhea, or leg swelling.  Past Medical History  Diagnosis Date  . Allergy   . ADHD (attention deficit hyperactivity disorder)   . Tremor     benign, essential  . Hematuria   . Hyperlipidemia    Past Surgical History  Procedure Laterality Date  . Knee surgery      right   Family History  Problem Relation Age of Onset  . Aneurysm Other     aortic  . Diabetes Other   . Hypertension Other   . Breast cancer Other   . Stroke Other     less 50 years   Social History  Substance Use Topics  . Smoking status:  Current Some Day Smoker -- 2 years    Types: Cigars  . Smokeless tobacco: Never Used  . Alcohol Use: Yes    Review of Systems  Respiratory: Negative for cough and shortness of breath.   Cardiovascular: Positive for chest pain. Negative for palpitations and leg swelling.  Gastrointestinal: Negative for nausea and vomiting.  All other systems reviewed and are negative.     Allergies  Review of patient's allergies indicates no known allergies.  Home Medications   Prior to Admission medications   Medication Sig Start Date End Date Taking? Authorizing Provider  aspirin EC 81 MG tablet Take 1 tablet (81 mg total) by mouth daily. 06/05/13  Yes Larey Dresser, MD  cetirizine (ZYRTEC) 10 MG chewable tablet Chew 10 mg by mouth daily as needed for allergies.    Yes Historical Provider, MD  CIALIS 20 MG tablet TAKE 1 TABLET ONCE DAILY AS NEEDED FOR ERECTILE DYSFUNCTION. 01/29/15  Yes Dorena Cookey, MD  diazepam (VALIUM) 5 MG tablet Take 1 tablet (5 mg total) by mouth as needed. For travel 03/04/13  Yes Dorena Cookey, MD  fish oil-omega-3 fatty acids 1000 MG capsule Take 2 g by mouth daily.     Yes Historical Provider, MD  ibuprofen (ADVIL,MOTRIN) 200 MG tablet Take 600-800 mg by mouth every 6 (six) hours as needed for moderate pain.  Yes Historical Provider, MD  Multiple Vitamin (MULTIVITAMIN) tablet Take 1 tablet by mouth daily.     Yes Historical Provider, MD  simvastatin (ZOCOR) 10 MG tablet TAKE ONE TABLET AT BEDTIME. 03/05/14  Yes Dorena Cookey, MD  cyclobenzaprine (FLEXERIL) 10 MG tablet Take 1 tablet (10 mg total) by mouth 2 (two) times daily as needed for muscle spasms. 02/01/15   Hanna Patel-Mills, PA-C  HYDROcodone-acetaminophen (NORCO/VICODIN) 5-325 MG per tablet Take 2 tablets by mouth every 4 (four) hours as needed. 02/01/15   Hanna Patel-Mills, PA-C  naproxen (NAPROSYN) 500 MG tablet Take 1 tablet (500 mg total) by mouth 2 (two) times daily. 02/01/15   Hanna Patel-Mills, PA-C   BP  128/91 mmHg  Pulse 56  Temp(Src) 98.2 F (36.8 C) (Oral)  Resp 21  SpO2 97% Physical Exam  Constitutional: He is oriented to person, place, and time. He appears well-developed and well-nourished.  HENT:  Head: Normocephalic and atraumatic.  Eyes: Conjunctivae are normal.  Neck: Normal range of motion. Neck supple.  Cardiovascular: Normal rate, regular rhythm and normal heart sounds.   Regular rate and rhythm. No murmur.  Pulmonary/Chest: Effort normal and breath sounds normal. No respiratory distress. He has no wheezes. He has no rales.    Lungs clear to auscultation bilaterally. No decreased breath sounds or wheezing.  Reproducible tenderness below the left axilla and along the left chest wall. No rash noted.  Abdominal: Soft. There is no tenderness.  Obese. No abdominal tenderness to palpation. No guarding or rebound.  Musculoskeletal: Normal range of motion.  No peripheral edema.  Neurological: He is alert and oriented to person, place, and time. He has normal strength. No sensory deficit. GCS eye subscore is 4. GCS verbal subscore is 5. GCS motor subscore is 6.  Ambulatory with steady gait.  Skin: Skin is warm and dry.  Nursing note and vitals reviewed.   ED Course  Procedures (including critical care time) Labs Review Labs Reviewed  BASIC METABOLIC PANEL - Abnormal; Notable for the following:    Glucose, Bld 100 (*)    All other components within normal limits  CBC  I-STAT TROPOININ, ED    Imaging Review No results found.   EKG interpretation 02/01/15 11:55:44 Vent. rate 59 BPM PR interval 141 ms QRS duration 90 ms QT/QTc 406/402 ms P-R-T axes 50 52 47 Sinus rhythm I agree with the interpretation of this EKG, Ottie Glazier, PA-C. MDM   Final diagnoses:  Pleurisy   Patient presents for chest wall pain that is worse with movement. I do not believe this is cardiac in nature. He has had these symptoms for several weeks after playing golf. His pain is  worse with movement and is reproducible. His vitals are stable and he is well-appearing. His labs are unremarkable. Troponin is negative. He had a chest x-ray for the same last week which was negative for any edema, infiltrate, or pneumothorax. His EKG is not concerning. Medications  ibuprofen (ADVIL,MOTRIN) tablet 800 mg (800 mg Oral Given 02/01/15 1214)   I discussed return precautions with the patient as well as follow-up and he verbally agrees with the plan. He should states he has close follow-up with his primary care physician. Rx: flexeril, naproxen, and vicodin   Ottie Glazier, PA-C 02/01/15 [place], MD 02/03/15 [phone]

## 2015-02-01 NOTE — ED Notes (Signed)
NAD at this time. Pt is stable and going home.  

## 2015-02-01 NOTE — ED Notes (Signed)
Pt reports few weeks ago while playing golf he experienced left sided chest pain. Pt seen by PMD and was told it was a pulled muscle. Pt took ibuprofen at that time and it felt better. Recently pt has been back to playing golf and reports pain returned. Pain feels like a pulled muscle pain.

## 2015-02-01 NOTE — ED Notes (Signed)
Pt reports L sided chest pain, increases with movement and deep breathing. 4/10 pain at the time. Respirations unlabored. Lung sounds clear and equal bilaterally.

## 2015-02-02 NOTE — Telephone Encounter (Signed)
Pt was seen in ED.

## 2015-02-22 ENCOUNTER — Other Ambulatory Visit: Payer: Self-pay | Admitting: Family Medicine

## 2015-03-11 ENCOUNTER — Other Ambulatory Visit (INDEPENDENT_AMBULATORY_CARE_PROVIDER_SITE_OTHER): Payer: BLUE CROSS/BLUE SHIELD

## 2015-03-11 DIAGNOSIS — Z0000 Encounter for general adult medical examination without abnormal findings: Secondary | ICD-10-CM | POA: Diagnosis not present

## 2015-03-11 LAB — HEPATIC FUNCTION PANEL
ALT: 17 U/L (ref 0–53)
AST: 19 U/L (ref 0–37)
Albumin: 4.3 g/dL (ref 3.5–5.2)
Alkaline Phosphatase: 65 U/L (ref 39–117)
BILIRUBIN TOTAL: 0.6 mg/dL (ref 0.2–1.2)
Bilirubin, Direct: 0.1 mg/dL (ref 0.0–0.3)
Total Protein: 7 g/dL (ref 6.0–8.3)

## 2015-03-11 LAB — LIPID PANEL
Cholesterol: 175 mg/dL (ref 0–200)
HDL: 46.8 mg/dL (ref >39.00)
LDL Cholesterol: 91 mg/dL (ref 0–99)
NONHDL: 128.5
Total CHOL/HDL Ratio: 4
Triglycerides: 189 mg/dL — ABNORMAL HIGH (ref 0.0–149.0)
VLDL: 37.8 mg/dL (ref 0.0–40.0)

## 2015-03-11 LAB — CBC WITH DIFFERENTIAL/PLATELET
Basophils Absolute: 0 10*3/uL (ref 0.0–0.1)
Basophils Relative: 0.3 % (ref 0.0–3.0)
EOS PCT: 5 % (ref 0.0–5.0)
Eosinophils Absolute: 0.3 10*3/uL (ref 0.0–0.7)
HEMATOCRIT: 45.3 % (ref 39.0–52.0)
HEMOGLOBIN: 15.5 g/dL (ref 13.0–17.0)
LYMPHS ABS: 1.9 10*3/uL (ref 0.7–4.0)
Lymphocytes Relative: 32 % (ref 12.0–46.0)
MCHC: 34.1 g/dL (ref 30.0–36.0)
MCV: 95.9 fl (ref 78.0–100.0)
MONOS PCT: 10.7 % (ref 3.0–12.0)
Monocytes Absolute: 0.6 10*3/uL (ref 0.1–1.0)
Neutro Abs: 3.1 10*3/uL (ref 1.4–7.7)
Neutrophils Relative %: 52 % (ref 43.0–77.0)
Platelets: 274 10*3/uL (ref 150.0–400.0)
RBC: 4.73 Mil/uL (ref 4.22–5.81)
RDW: 12.6 % (ref 11.5–15.5)
WBC: 6 10*3/uL (ref 4.0–10.5)

## 2015-03-11 LAB — BASIC METABOLIC PANEL
BUN: 24 mg/dL — AB (ref 6–23)
CHLORIDE: 106 meq/L (ref 96–112)
CO2: 29 mEq/L (ref 19–32)
CREATININE: 1.1 mg/dL (ref 0.40–1.50)
Calcium: 9.9 mg/dL (ref 8.4–10.5)
GFR: 72.96 mL/min (ref >60.00)
Glucose, Bld: 101 mg/dL — ABNORMAL HIGH (ref 70–99)
Potassium: 4.9 mEq/L (ref 3.5–5.1)
Sodium: 142 mEq/L (ref 135–145)

## 2015-03-11 LAB — PSA: PSA: 0.8 ng/mL (ref 0.10–4.00)

## 2015-03-11 LAB — TSH: TSH: 2.12 u[IU]/mL (ref 0.35–4.50)

## 2015-03-16 ENCOUNTER — Encounter: Payer: Self-pay | Admitting: Family Medicine

## 2015-03-17 ENCOUNTER — Encounter: Payer: Self-pay | Admitting: Internal Medicine

## 2015-03-23 ENCOUNTER — Ambulatory Visit (INDEPENDENT_AMBULATORY_CARE_PROVIDER_SITE_OTHER): Payer: BLUE CROSS/BLUE SHIELD | Admitting: Family Medicine

## 2015-03-23 ENCOUNTER — Encounter: Payer: Self-pay | Admitting: Family Medicine

## 2015-03-23 VITALS — BP 124/84 | Temp 98.2°F | Ht 70.5 in | Wt 213.0 lb

## 2015-03-23 DIAGNOSIS — Z0000 Encounter for general adult medical examination without abnormal findings: Secondary | ICD-10-CM

## 2015-03-23 DIAGNOSIS — Z23 Encounter for immunization: Secondary | ICD-10-CM

## 2015-03-23 DIAGNOSIS — E785 Hyperlipidemia, unspecified: Secondary | ICD-10-CM

## 2015-03-23 DIAGNOSIS — G250 Essential tremor: Secondary | ICD-10-CM | POA: Diagnosis not present

## 2015-03-23 DIAGNOSIS — R3129 Other microscopic hematuria: Secondary | ICD-10-CM

## 2015-03-23 DIAGNOSIS — F4323 Adjustment disorder with mixed anxiety and depressed mood: Secondary | ICD-10-CM

## 2015-03-23 DIAGNOSIS — N529 Male erectile dysfunction, unspecified: Secondary | ICD-10-CM

## 2015-03-23 MED ORDER — SILDENAFIL CITRATE 20 MG PO TABS: 20.0000 mg | ORAL_TABLET | Freq: Three times a day (TID) | ORAL | Status: DC

## 2015-03-23 MED ORDER — DIAZEPAM 5 MG PO TABS: 5.0000 mg | ORAL_TABLET | ORAL | Status: DC | PRN

## 2015-03-23 MED ORDER — TADALAFIL 20 MG PO TABS: ORAL_TABLET | ORAL | Status: DC

## 2015-03-23 MED ORDER — SIMVASTATIN 10 MG PO TABS: ORAL_TABLET | ORAL | Status: DC

## 2015-03-23 NOTE — Patient Instructions (Signed)
Cialis 20 mg or revatio 20 mg.......... [email or web address]  Continue other medications  Return in one year for general physical exam sooner if any problems  [place] or Almyra Free......... are 2 new adult nurse practitioner's

## 2015-03-23 NOTE — Progress Notes (Signed)
Pre visit review using our clinic review tool, if applicable. No additional management support is needed unless otherwise documented below in the visit note. 

## 2015-03-23 NOTE — Progress Notes (Signed)
   Subjective:    Patient ID: Dakota Barker, male    DOB: 17-Jul-1955, 59 y.o.   MRN: [id number]  HPI Dakota Barker is a 59 year old married male nonsmoker who comes in today for general physical examination because of a history of allergic rhinitis, hyperlipidemia, erectile dysfunction, benign tremor  He takes Zyrtec for allergic rhinitis  He takes Zocor 10 mg daily bedtime along with an aspirin tablet because a history of hyperlipidemia  He uses at Cialis 20 mg when necessary for ED  He uses Valium 5 mg as needed for travel  He had a chest wall pain this year hitting golf balls. He called here and was sent to the emergency room. Cardiac evaluation was negative.  Vaccinations up-to-date  He gets routine eye care, dental care, and screening colonoscopy 2009.   Review of Systems  Constitutional: Negative.   HENT: Negative.   Eyes: Negative.   Respiratory: Negative.   Cardiovascular: Negative.   Gastrointestinal: Negative.   Endocrine: Negative.   Genitourinary: Negative.   Musculoskeletal: Negative.   Skin: Negative.   Allergic/Immunologic: Negative.   Neurological: Negative.   Hematological: Negative.   Psychiatric/Behavioral: Negative.        Objective:   Physical Exam  Constitutional: He is oriented to person, place, and time. He appears well-developed and well-nourished.  HENT:  Head: Normocephalic and atraumatic.  Right Ear: External ear normal.  Left Ear: External ear normal.  Nose: Nose normal.  Mouth/Throat: Oropharynx is clear and moist.  Eyes: Conjunctivae and EOM are normal. Pupils are equal, round, and reactive to light.  Neck: Normal range of motion. Neck supple. No JVD present. No tracheal deviation present. No thyromegaly present.  Cardiovascular: Normal rate, regular rhythm, normal heart sounds and intact distal pulses.  Exam reveals no gallop and no friction rub.   No murmur heard. Pulmonary/Chest: Effort normal and breath sounds normal. No stridor. No  respiratory distress. He has no wheezes. He has no rales. He exhibits no tenderness.  Abdominal: Soft. Bowel sounds are normal. He exhibits no distension and no mass. There is no tenderness. There is no rebound and no guarding.  Genitourinary: Rectum normal, prostate normal and penis normal. Guaiac negative stool. No penile tenderness.  Musculoskeletal: Normal range of motion. He exhibits no edema or tenderness.  Lymphadenopathy:    He has no cervical adenopathy.  Neurological: He is alert and oriented to person, place, and time. He has normal reflexes. No cranial nerve deficit. He exhibits normal muscle tone.  Benign familial tremor  Skin: Skin is warm and dry. No rash noted. No erythema. No pallor.  Total body skin exam normal except for lesion right lower extremity. It appears to be an actinic keratosis. He tells me his dermatologist took it off last year now it's recurred. Referred back to dermatology  Psychiatric: He has a normal mood and affect. His behavior is normal. Judgment and thought content normal.  Nursing note and vitals reviewed.         Assessment & Plan:  Healthy male  Hyperlipidemia....... continue Zocor  Benign tremor.....Marland Kitchen observe  Allergic rhinitis,,,,,,,, continue OTC medicine  Erectile dysfunction.......Marland Kitchen refill Cialis.Marland Kitchen

## 2015-07-23 ENCOUNTER — Ambulatory Visit: Payer: BLUE CROSS/BLUE SHIELD | Admitting: Adult Health

## 2015-08-02 ENCOUNTER — Telehealth: Payer: Self-pay | Admitting: Family Medicine

## 2015-08-02 NOTE — Telephone Encounter (Signed)
Please schedule 08/03/15 at 1:30 patient is aware

## 2015-08-02 NOTE — Telephone Encounter (Signed)
Pt has been sch

## 2015-08-02 NOTE — Telephone Encounter (Signed)
Patient Name: Dakota Barker  DOB: 05/06/1956    Initial Comment Caller states he has cough, congestion, feeling bad for 10 days    Nurse Assessment  Nurse: Leilani Merl, RN, Heather Date/Time (Eastern Time): 08/02/2015 10:16:57 AM  Confirm and document reason for call. If symptomatic, describe symptoms. You must click the next button to save text entered. ---Caller states he has cough, congestion, feeling bad for 11 days, he was seen at [facility] on 03/17 and was given naprosyn and cough med.  Has the patient traveled out of the country within the last 30 days? ---Not Applicable  Does the patient have any new or worsening symptoms? ---Yes  Will a triage be completed? ---Yes  Related visit to physician within the last 2 weeks? ---Yes  Does the PT have any chronic conditions? (i.e. diabetes, asthma, etc.) ---No  Is this a behavioral health or substance abuse call? ---No     Guidelines    Guideline Title Affirmed Question Affirmed Notes  Cough - Acute Productive Wheezing is present    Final Disposition User   See Physician within 4 Hours (or PCP triage) Leilani Merl, RN, Nira Conn    Comments  caller is a patient of Dr. Sherren Mocha and needs to be seen within 4 hours if possible.   Referrals  REFERRED TO PCP OFFICE   Disagree/Comply: Comply

## 2015-08-03 ENCOUNTER — Ambulatory Visit (INDEPENDENT_AMBULATORY_CARE_PROVIDER_SITE_OTHER): Payer: BLUE CROSS/BLUE SHIELD | Admitting: Family Medicine

## 2015-08-03 ENCOUNTER — Encounter: Payer: Self-pay | Admitting: Family Medicine

## 2015-08-03 VITALS — BP 110/80 | Temp 98.0°F | Wt 218.0 lb

## 2015-08-03 DIAGNOSIS — J4521 Mild intermittent asthma with (acute) exacerbation: Secondary | ICD-10-CM

## 2015-08-03 DIAGNOSIS — J45909 Unspecified asthma, uncomplicated: Secondary | ICD-10-CM | POA: Insufficient documentation

## 2015-08-03 MED ORDER — HYDROCODONE-HOMATROPINE 5-1.5 MG/5ML PO SYRP: 5.0000 mL | ORAL_SOLUTION | Freq: Three times a day (TID) | ORAL | Status: DC | PRN

## 2015-08-03 MED ORDER — PREDNISONE 20 MG PO TABS: ORAL_TABLET | ORAL | Status: DC

## 2015-08-03 NOTE — Patient Instructions (Signed)
Drink lots of water  Do not take any over-the-counter medications  Prednisone 20 mg............ 2 tabs 3 days then taper as outlined  Hydromet........Marland Kitchen 1/2-1 teaspoon at bedtime when necessary for nighttime cough  Return when necessary

## 2015-08-03 NOTE — Progress Notes (Signed)
   Subjective:    Patient ID: Dakota Barker, male    DOB: Nov 26, 1955, 60 y.o.   MRN: [id number]  HPI Dakota Barker is a 60 year old married male nonsmoker who comes in today for evaluation of a persistent cough for 2 weeks  He developed a cough about 2 weeks he went to an urgent care center week ago had a screening test for influenza which was negative. He was given a cough suppressant capsule and Naprosyn. He comes in today saying he still coughing. No fever.   Review of Systems Review of systems negative no history of pulmonary disease asthma etc. 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

## 2015-08-03 NOTE — Progress Notes (Signed)
Pre visit review using our clinic review tool, if applicable. No additional management support is needed unless otherwise documented below in the visit note. 

## 2016-01-03 ENCOUNTER — Encounter: Payer: Self-pay | Admitting: Family Medicine

## 2016-01-03 ENCOUNTER — Ambulatory Visit (INDEPENDENT_AMBULATORY_CARE_PROVIDER_SITE_OTHER): Payer: BLUE CROSS/BLUE SHIELD | Admitting: Family Medicine

## 2016-01-03 VITALS — BP 110/70 | HR 67 | Temp 98.5°F | Ht 69.5 in | Wt 214.5 lb

## 2016-01-03 DIAGNOSIS — E785 Hyperlipidemia, unspecified: Secondary | ICD-10-CM

## 2016-01-03 DIAGNOSIS — G250 Essential tremor: Secondary | ICD-10-CM

## 2016-01-03 DIAGNOSIS — F411 Generalized anxiety disorder: Secondary | ICD-10-CM | POA: Insufficient documentation

## 2016-01-03 DIAGNOSIS — F17200 Nicotine dependence, unspecified, uncomplicated: Secondary | ICD-10-CM | POA: Diagnosis not present

## 2016-01-03 DIAGNOSIS — F4323 Adjustment disorder with mixed anxiety and depressed mood: Secondary | ICD-10-CM

## 2016-01-03 DIAGNOSIS — Z1211 Encounter for screening for malignant neoplasm of colon: Secondary | ICD-10-CM | POA: Diagnosis not present

## 2016-01-03 DIAGNOSIS — R002 Palpitations: Secondary | ICD-10-CM

## 2016-01-03 DIAGNOSIS — N529 Male erectile dysfunction, unspecified: Secondary | ICD-10-CM

## 2016-01-03 MED ORDER — DIAZEPAM 5 MG PO TABS
5.0000 mg | ORAL_TABLET | ORAL | 3 refills | Status: DC | PRN
Start: 2016-01-03 — End: 2017-04-12

## 2016-01-03 MED ORDER — SIMVASTATIN 10 MG PO TABS: ORAL_TABLET | ORAL | 3 refills | Status: DC

## 2016-01-03 MED ORDER — TADALAFIL 20 MG PO TABS: ORAL_TABLET | ORAL | 5 refills | Status: DC

## 2016-01-03 NOTE — Progress Notes (Signed)
Phone: [phone]  Subjective:  Patient presents today to establish care with me as their new primary care provider. Patient was formerly a patient of Dr. Sherren Mocha. Chief complaint-noted.   See problem oriented charting- ROS- no rectal bleeding. No chest pain or shortness of breath. No headache or blurry vision.   The following were reviewed and entered/updated in epic: Past Medical History:  Diagnosis Date  . ADHD (attention deficit hyperactivity disorder)    no treatment  . Allergy   . Hematuria   . Hyperlipidemia   . Skin cancer    nose- radiation only. Cared for regularly Dr. Wilhemina Bonito.  . Tremor    benign, essential   Patient Active Problem List   Diagnosis Date Noted  . Tobacco use disorder 02/23/2009    Priority: High  . Anxiety state 01/03/2016    Priority: Medium  . Palpitations 06/05/2013    Priority: Medium  . Hyperlipidemia, mild 12/10/2007    Priority: Medium  . Benign essential tremor 12/10/2007    Priority: Medium  . Reactive airway disease 08/03/2015    Priority: Low  . Erectile dysfunction 03/05/2014    Priority: Low  . Microscopic hematuria 12/10/2007    Priority: Low  . Allergic rhinitis 01/04/2007    Priority: Low   Past Surgical History:  Procedure Laterality Date  . [place]   right  . left heel surgery     Dr. Noemi Chapel.around 2000    Family History  Problem Relation Age of Onset  . Diabetes Mother   . Congestive Heart Failure Mother     passed at 80  . Aortic aneurysm Mother   . Hypertension Mother   . Stroke Mother     70  . Breast cancer Father 77  . Diabetes Brother   . Thyroid cancer Brother   . Other Brother     adopted    Medications- reviewed and updated Current Outpatient Prescriptions  Medication Sig Dispense Refill  . aspirin EC 81 MG tablet Take 1 tablet (81 mg total) by mouth daily.    . cetirizine (ZYRTEC) 10 MG chewable tablet Chew 10 mg by mouth daily as needed for allergies.     . diazepam (VALIUM) 5  MG tablet Take 1 tablet (5 mg total) by mouth as needed. For travel 30 tablet 3  . fish oil-omega-3 fatty acids 1000 MG capsule Take 2 g by mouth daily.      Marland Kitchen ibuprofen (ADVIL,MOTRIN) 200 MG tablet Take 600-800 mg by mouth every 6 (six) hours as needed for moderate pain.    . Multiple Vitamin (MULTIVITAMIN) tablet Take 1 tablet by mouth daily.      . simvastatin (ZOCOR) 10 MG tablet TAKE ONE TABLET AT BEDTIME. 95 tablet 3  . tadalafil (CIALIS) 20 MG tablet TAKE 1 TABLET ONCE DAILY AS NEEDED FOR ERECTILE DYSFUNCTION. 30 tablet 5   No current facility-administered medications for this visit.     Allergies-reviewed and updated No Known Allergies  Social History   Social History  . Marital status: Married    Spouse name: N/A  . Number of children: N/A  . Years of education: N/A   Social History Main Topics  . Smoking status: Current Some Day Smoker    Years: 2.00    Types: Cigars  . Smokeless tobacco: Never Used  . Alcohol use Yes     Comment: social only  . Drug use: No  . Sexual activity: Not Asked   Other Topics Concern  .  None   Social History Narrative   Married 1984. 3 children- 29/28/27- in 2017. 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 Enjoys his work- 36 years in 2017.       Hobbies: golf, enjoys cigars          ROS--See HPI   Objective: BP 110/70 (BP Location: Left Arm, Patient Position: Sitting, Cuff Size: Large)   Pulse 67   Temp 98.5 F (36.9 C) (Oral)   Ht 5' 9.5" (1.765 m)   Wt 214 lb 8 oz (97.3 kg)   SpO2 98%   BMI 31.22 kg/m  Gen: NAD, resting comfortably HEENT: Mucous membranes are moist. Oropharynx normal Neck: no thyromegaly CV: RRR no murmurs rubs or gallops Lungs: CTAB no crackles, wheeze, rhonchi Abdomen: soft/nontender/nondistended/normal bowel sounds. No rebound or guarding.  Ext: no edema Skin: warm, dry Neuro: grossly normal, moves all extremities,  PERRLA  Assessment/Plan:  Colonoscopy 2009. Patient would like to repeat at this time. Cousin with colon cancer. He wants to discuss with GI to see if would be approved for colonoscopy about a year to 18 months early than 2019 repeat. hyperplastic polyp only in 2009.   Tobacco use disorder S: smoked cigarettes as a teenager. Now he only smokes cigars about twice a week currently- previously more A/P: I encouraged complete cessation- patient not ready to quit. In fact motivation extremely low. Instead he wants to take yearly CXR. Would not qualify for lung cancer screening- we discussed both options. Will set this up at physical as well   Palpitations S: Palpitations at night at least 2 years. Resolves with cough. Worse with stress and caffeine. holter monitor occasional PACs- some sinus brady and sinus tachycardia. Saw cardiology- has not had stress test A/P: could consider beta blocker- may help tremor (though I doubt as this is more of a twitch of head/neck). Symptoms very mild- will monitor only. Return to cardiology if needed- no stress test was done- if worsens would refer back for their opinion.   Hyperlipidemia, mild S: reasonably controlled on simvastatin 10mg . No myalgias.  Lab Results  Component Value Date   CHOL 175 03/11/2015   HDL 46.80 03/11/2015   LDLCALC 91 03/11/2015   LDLDIRECT 87.8 07/04/2010   TRIG 189.0 (H) 03/11/2015   CHOLHDL 4 03/11/2015   A/P: also continue aspirin for primary prevention- continue statin as well   Benign essential tremor S: Started in 20s- head twitch. Goes away and comes back. Worse with stress. Does not improve with alcohol A/P:- not sure if this is really essential tremor. Could get neuro consult in future if needed/desired. Only mild issue for patient at present.   Anxiety state S: Valium for travel, needs window seat. He does not use this medication otherwise A/P: refilled med- will be traveling more [place]   Erectile dysfunction S: controlled with cialis 20mg  (cuts in half) A/P: continue current rx- doing well   Next available CPE with labs beforehand  Orders Placed This Encounter  Procedures  . Ambulatory referral to Gastroenterology    Referral Priority:   Routine    Referral Type:   Consultation    Referral Reason:   Specialty Services Required    Number of Visits Requested:   1    Meds ordered this encounter  Medications  . diazepam (VALIUM) 5 MG tablet    Sig: Take 1 tablet (5 mg total) by  mouth as needed. For travel    Dispense:  30 tablet    Refill:  3  . simvastatin (ZOCOR) 10 MG tablet    Sig: TAKE ONE TABLET AT BEDTIME.    Dispense:  95 tablet    Refill:  3  . tadalafil (CIALIS) 20 MG tablet    Sig: TAKE 1 TABLET ONCE DAILY AS NEEDED FOR ERECTILE DYSFUNCTION.    Dispense:  30 tablet    Refill:  5   The duration of face-to-face time during this visit was 30 minutes. Greater than 50% of this time was spent in counseling, explanation of diagnosis, planning of further management, and/or coordination of care.    Return precautions advised.   Garret Reddish, MD

## 2016-01-03 NOTE — Patient Instructions (Addendum)
We will call you within a week about your referral to GI. If you do not hear within 2 weeks, give [place] a call.   Flu shot in October or November (see if we can bundle with physical).  Schedule next available physical before you leave. Can use any 30 minute slot for this  Health Maintenance Due  Topic Date Due  . Hepatitis C Screening -ask lab to add when you come 1956/02/26  . HIV Screening - declined  09/20/1971  . INFLUENZA VACCINE  12/07/2015

## 2016-01-03 NOTE — Progress Notes (Signed)
Pre visit review using our clinic review tool, if applicable. No additional management support is needed unless otherwise documented below in the visit note. 

## 2016-01-04 NOTE — Assessment & Plan Note (Signed)
S: Started in 20s- head twitch. Goes away and comes back. Worse with stress. Does not improve with alcohol A/P:- not sure if this is really essential tremor. Could get neuro consult in future if needed/desired. Only mild issue for patient at present.

## 2016-01-04 NOTE — Assessment & Plan Note (Signed)
S: controlled with cialis 20mg  (cuts in half) A/P: continue current rx- doing well

## 2016-01-04 NOTE — Assessment & Plan Note (Signed)
S: smoked cigarettes as a teenager. Now he only smokes cigars about twice a week currently- previously more A/P: I encouraged complete cessation- patient not ready to quit. In fact motivation extremely low. Instead he wants to take yearly CXR. Would not qualify for lung cancer screening- we discussed both options. Will set this up at physical as well

## 2016-01-04 NOTE — Assessment & Plan Note (Signed)
S: reasonably controlled on simvastatin 10mg . No myalgias.  Lab Results  Component Value Date   CHOL 175 03/11/2015   HDL 46.80 03/11/2015   LDLCALC 91 03/11/2015   LDLDIRECT 87.8 07/04/2010   TRIG 189.0 (H) 03/11/2015   CHOLHDL 4 03/11/2015   A/P: also continue aspirin for primary prevention- continue statin as well

## 2016-01-04 NOTE — Assessment & Plan Note (Signed)
S: Valium for travel, needs window seat. He does not use this medication otherwise A/P: refilled med- will be traveling more to see [place] overseas

## 2016-01-04 NOTE — Assessment & Plan Note (Signed)
S: Palpitations at night at least 2 years. Resolves with cough. Worse with stress and caffeine. holter monitor occasional PACs- some sinus brady and sinus tachycardia. Saw cardiology- has not had stress test A/P: could consider beta blocker- may help tremor (though I doubt as this is more of a twitch of head/neck). Symptoms very mild- will monitor only. Return to cardiology if needed- no stress test was done- if worsens would refer back for their opinion.

## 2016-01-12 ENCOUNTER — Telehealth: Payer: Self-pay | Admitting: Internal Medicine

## 2016-01-12 NOTE — Telephone Encounter (Signed)
Pt due for recall colon in 2019. Pt saw his PCP and requested to go ahead and have colon done, states he has a cousin that has been diagnosed with colon cancer. Please advise if ok to schedule for colon or would you want pt to have an OV. Please advise.

## 2016-01-12 NOTE — Telephone Encounter (Signed)
patient was referred to our office for a colonoscopy. I informed patient that he was not due for another colon until 2019. I asked him if he was having any problems and he states "no" just that Dr. Yong Channel referred him for a colonoscopy. I informed patient that I could not schedule a colonoscopy since he is not due for one until 2019 but that I could schedule him an OV to discuss colonoscopy. Patient became upset that I wouldn't schedule him a colonoscopy and is requesting to speak to Dr. Henrene Pastor.

## 2016-01-12 NOTE — Telephone Encounter (Signed)
Okay to schedule patient for direct colonoscopy and [facility]. Tell him that I'm sorry for any confusion. Thank you Vaughan Basta

## 2016-01-13 NOTE — Telephone Encounter (Signed)
Pt scheduled for previsit 03/07/16@8am , colon scheduled in the [place] [place] 03/15/16@8am . Pt aware of appt.

## 2016-03-07 ENCOUNTER — Ambulatory Visit (AMBULATORY_SURGERY_CENTER): Payer: Self-pay

## 2016-03-07 ENCOUNTER — Encounter: Payer: Self-pay | Admitting: Internal Medicine

## 2016-03-07 VITALS — Ht 70.0 in | Wt 210.0 lb

## 2016-03-07 DIAGNOSIS — Z1211 Encounter for screening for malignant neoplasm of colon: Secondary | ICD-10-CM

## 2016-03-07 MED ORDER — SUPREP BOWEL PREP KIT 17.5-3.13-1.6 GM/177ML PO SOLN: 1.0000 | Freq: Once | ORAL | 0 refills | Status: AC

## 2016-03-07 NOTE — Progress Notes (Signed)
No allergies to eggs or soy No past problems with anesthesia No diet meds No home oxygen  2nd colonoscopy 1st one at age 60  Declined emmi

## 2016-03-14 ENCOUNTER — Telehealth: Payer: Self-pay | Admitting: Family Medicine

## 2016-03-14 NOTE — Telephone Encounter (Signed)
Pt has cpe on 12/15 . Every year with Dr Sherren Mocha pt has gotten a chest xray because he smokes cigars. Would like dr Yong Channel to put in an order for this xray at [facility]

## 2016-03-15 ENCOUNTER — Encounter: Payer: Self-pay | Admitting: Internal Medicine

## 2016-03-15 ENCOUNTER — Ambulatory Visit (AMBULATORY_SURGERY_CENTER): Payer: BLUE CROSS/BLUE SHIELD | Admitting: Internal Medicine

## 2016-03-15 VITALS — BP 111/67 | HR 55 | Temp 96.8°F | Resp 11 | Ht 70.0 in | Wt 210.0 lb

## 2016-03-15 DIAGNOSIS — Z1212 Encounter for screening for malignant neoplasm of rectum: Secondary | ICD-10-CM | POA: Diagnosis not present

## 2016-03-15 DIAGNOSIS — Z1211 Encounter for screening for malignant neoplasm of colon: Secondary | ICD-10-CM

## 2016-03-15 DIAGNOSIS — Z800 Family history of malignant neoplasm of digestive organs: Secondary | ICD-10-CM

## 2016-03-15 MED ORDER — SODIUM CHLORIDE 0.9 % IV SOLN: 500.0000 mL | INTRAVENOUS | Status: DC

## 2016-03-15 NOTE — Patient Instructions (Signed)
YOU HAD AN ENDOSCOPIC PROCEDURE TODAY AT [facility]:   Refer to the procedure report that was given to you for any specific questions about what was found during the examination.  If the procedure report does not answer your questions, please call your gastroenterologist to clarify.  If you requested that your care partner not be given the details of your procedure findings, then the procedure report has been included in a sealed envelope for you to review at your convenience later.  YOU SHOULD EXPECT: Some feelings of bloating in the abdomen. Passage of more gas than usual.  Walking can help get rid of the air that was put into your GI tract during the procedure and reduce the bloating. If you had a lower endoscopy (such as a colonoscopy or flexible sigmoidoscopy) you may notice spotting of blood in your stool or on the toilet paper. If you underwent a bowel prep for your procedure, you may not have a normal bowel movement for a few days.  Please Note:  You might notice some irritation and congestion in your nose or some drainage.  This is from the oxygen used during your procedure.  There is no need for concern and it should clear up in a day or so.  SYMPTOMS TO REPORT IMMEDIATELY:   Following lower endoscopy (colonoscopy or flexible sigmoidoscopy):  Excessive amounts of blood in the stool  Significant tenderness or worsening of abdominal pains  Swelling of the abdomen that is new, acute  Fever of 100F or higher   For urgent or emergent issues, a gastroenterologist can be reached at any hour by calling [phone].   DIET:  We do recommend a small meal at first, but then you may proceed to your regular diet.  Drink plenty of fluids but you should avoid alcoholic beverages for 24 hours.  ACTIVITY:  You should plan to take it easy for the rest of today and you should NOT DRIVE or use heavy machinery until tomorrow (because of the sedation medicines used during the test).     FOLLOW UP: Our staff will call the number listed on your records the next business day following your procedure to check on you and address any questions or concerns that you may have regarding the information given to you following your procedure. If we do not reach you, we will leave a message.  However, if you are feeling well and you are not experiencing any problems, there is no need to return our call.  We will assume that you have returned to your regular daily activities without incident.  If any biopsies were taken you will be contacted by phone or by letter within the next 1-3 weeks.  Please call us at [phone] if you have not heard about the biopsies in 3 weeks.    SIGNATURES/CONFIDENTIALITY: You and/or your care partner have signed paperwork which will be entered into your electronic medical record.  These signatures attest to the fact that that the information above on your After Visit Summary has been reviewed and is understood.  Full responsibility of the confidentiality of this discharge information lies with you and/or your care-partner.  Diverticulosis, high fiber diet, and hemorrhoid information given.  Next colonoscopy 10 years-2027.

## 2016-03-15 NOTE — Op Note (Signed)
[facility] Patient Name: Dakota Barker Procedure Date: 03/15/2016 7:58 AM MRN: [id number] Endoscopist: Docia Chuck. Henrene Pastor , MD Age: 60 Referring MD:  Date of Birth: 08-19-1955 Gender: Male Account #: [id number] Procedure:                Colonoscopy Indications:              Colon cancer screening in patient at increased                            risk: Family history of colorectal cancer in 2nd                            degree relatives Medicines:                Monitored Anesthesia Care Procedure:                Pre-Anesthesia Assessment:                           - Prior to the procedure, a History and Physical                            was performed, and patient medications and                            allergies were reviewed. The patient's tolerance of                            previous anesthesia was also reviewed. The risks                            and benefits of the procedure and the sedation                            options and risks were discussed with the patient.                            All questions were answered, and informed consent                            was obtained. Prior Anticoagulants: The patient has                            taken no previous anticoagulant or antiplatelet                            agents. ASA Grade Assessment: II - A patient with                            mild systemic disease. After reviewing the risks                            and benefits, the patient was deemed in  satisfactory condition to undergo the procedure.                           After obtaining informed consent, the colonoscope                            was passed under direct vision. Throughout the                            procedure, the patient's blood pressure, pulse, and                            oxygen saturations were monitored continuously. The                            Model CF-HQ190L [id number]) scope was  introduced                            through the anus and advanced to the the cecum,                            identified by appendiceal orifice and ileocecal                            valve. The ileocecal valve, appendiceal orifice,                            and rectum were photographed. The quality of the                            bowel preparation was excellent. The colonoscopy                            was performed without difficulty. The patient                            tolerated the procedure well. The bowel preparation                            used was SUPREP. Scope In: 8:11:49 AM Scope Out: 8:25:43 AM Scope Withdrawal Time: 0 hours 11 minutes 26 seconds  Total Procedure Duration: 0 hours 13 minutes 54 seconds  Findings:                 A few small-mouthed diverticula were found in the                            left colon.                           Internal hemorrhoids were found during                            retroflexion. The hemorrhoids were small.  The exam was otherwise without abnormality on                            direct and retroflexion views. Complications:            No immediate complications. Estimated blood loss:                            None. Estimated Blood Loss:     Estimated blood loss: none. Impression:               - Diverticulosis in the left colon.                           - Internal hemorrhoids.                           - The examination was otherwise normal on direct                            and retroflexion views.                           - No specimens collected. Recommendation:           - Repeat colonoscopy in 10 years for screening                            purposes.                           - Patient has a contact number available for                            emergencies. The signs and symptoms of potential                            delayed complications were discussed with the                             patient. Return to normal activities tomorrow.                            Written discharge instructions were provided to the                            patient.                           - Resume previous diet.                           - Continue present medications. Docia Chuck. Henrene Pastor, MD 03/15/2016 8:36:44 AM This report has been signed electronically.

## 2016-03-15 NOTE — Progress Notes (Signed)
To PACU, vss patent aw report to rn 

## 2016-03-15 NOTE — Telephone Encounter (Signed)
Yes thanks can order under current smoker. Please tell him without symptoms I am not sure about insurance coverage for this but if he would like to do this and willing to pay out of pocket if not covered then I am ok with it.

## 2016-03-16 ENCOUNTER — Telehealth: Payer: Self-pay

## 2016-03-16 NOTE — Telephone Encounter (Signed)
  Follow up Call-  Call back number 03/15/2016  Post procedure Call Back phone  # [phone]  Permission to leave phone message Yes  Some recent data might be hidden     Patient questions:  Do you have a fever, pain , or abdominal swelling? No. Pain Score  0 *  Have you tolerated food without any problems? Yes.    Have you been able to return to your normal activities? Yes.    Do you have any questions about your discharge instructions: Diet   No. Medications  No. Follow up visit  No.  Do you have questions or concerns about your Care? No.  Actions: * If pain score is 4 or above: No action needed, pain <4.

## 2016-03-17 ENCOUNTER — Other Ambulatory Visit: Payer: Self-pay

## 2016-03-17 DIAGNOSIS — F17200 Nicotine dependence, unspecified, uncomplicated: Secondary | ICD-10-CM

## 2016-03-17 NOTE — Telephone Encounter (Signed)
Spoke with patient who stated to go ahead and order it and if it was not covered by insurance he would pay out of pocket for it.

## 2016-03-27 ENCOUNTER — Telehealth: Payer: Self-pay | Admitting: Family Medicine

## 2016-03-27 NOTE — Telephone Encounter (Signed)
Pt need new Rx of 2 pills for sleeping due to him being on the plane for 8 hrs. And also Valium    Pharm:  [facility]

## 2016-03-27 NOTE — Telephone Encounter (Signed)
Valium is a benzodiazepine. He should not use both this and a sleeping pill- that could be a dangerous combination. The valium does enough for relaxation- can also help with sleep.  What sleeping pill is he talking about- ambien?  If he is wanting to use an [place] on the trip INSTEAD of valium that is reasonable and could do #2 ambien 5mg  as needed for sleep

## 2016-04-04 NOTE — Telephone Encounter (Signed)
Attempted to call patient but received a message that patients phone is not working

## 2016-04-06 NOTE — Telephone Encounter (Signed)
Called patient but again received message that due to technical difficulty the patient;s phone is not working

## 2016-04-10 ENCOUNTER — Ambulatory Visit (INDEPENDENT_AMBULATORY_CARE_PROVIDER_SITE_OTHER)
Admission: RE | Admit: 2016-04-10 | Discharge: 2016-04-10 | Disposition: A | Discharge status: Home / Self Care | Payer: BLUE CROSS/BLUE SHIELD | Source: Ambulatory Visit | Attending: Family Medicine | Admitting: Family Medicine

## 2016-04-10 DIAGNOSIS — F17200 Nicotine dependence, unspecified, uncomplicated: Secondary | ICD-10-CM | POA: Diagnosis not present

## 2016-04-11 ENCOUNTER — Encounter: Payer: Self-pay | Admitting: Family Medicine

## 2016-04-11 ENCOUNTER — Other Ambulatory Visit: Payer: Self-pay

## 2016-04-11 MED ORDER — ZOLPIDEM TARTRATE 5 MG PO TABS: 5.0000 mg | ORAL_TABLET | Freq: Every evening | ORAL | 0 refills | Status: DC | PRN

## 2016-04-11 NOTE — Telephone Encounter (Signed)
Spoke with patient who states he knew I had been trying to call. He has been traveling and in meetings. He stated he will be traveling here again in a couple of weeks. He asked for the 2 Ambien and we discussed not taking the Ambien and the Valium together. He verbalized understanding.   We also discussed his lab results. He verbalized understanding

## 2016-04-13 ENCOUNTER — Other Ambulatory Visit (INDEPENDENT_AMBULATORY_CARE_PROVIDER_SITE_OTHER): Payer: BLUE CROSS/BLUE SHIELD

## 2016-04-13 DIAGNOSIS — Z125 Encounter for screening for malignant neoplasm of prostate: Secondary | ICD-10-CM

## 2016-04-13 DIAGNOSIS — Z0000 Encounter for general adult medical examination without abnormal findings: Secondary | ICD-10-CM

## 2016-04-13 DIAGNOSIS — E785 Hyperlipidemia, unspecified: Secondary | ICD-10-CM | POA: Diagnosis not present

## 2016-04-13 LAB — BASIC METABOLIC PANEL
BUN: 18 mg/dL (ref 6–23)
CALCIUM: 9.5 mg/dL (ref 8.4–10.5)
CO2: 27 meq/L (ref 19–32)
CREATININE: 1.08 mg/dL (ref 0.40–1.50)
Chloride: 108 mEq/L (ref 96–112)
GFR: 74.24 mL/min (ref >60.00)
Glucose, Bld: 102 mg/dL — ABNORMAL HIGH (ref 70–99)
Potassium: 4.8 mEq/L (ref 3.5–5.1)
Sodium: 144 mEq/L (ref 135–145)

## 2016-04-13 LAB — CBC WITH DIFFERENTIAL/PLATELET
BASOS ABS: 0 10*3/uL (ref 0.0–0.1)
Basophils Relative: 0.4 % (ref 0.0–3.0)
EOS ABS: 0.2 10*3/uL (ref 0.0–0.7)
Eosinophils Relative: 3.5 % (ref 0.0–5.0)
HEMATOCRIT: 45.2 % (ref 39.0–52.0)
Hemoglobin: 15.5 g/dL (ref 13.0–17.0)
LYMPHS ABS: 2.2 10*3/uL (ref 0.7–4.0)
LYMPHS PCT: 33.2 % (ref 12.0–46.0)
MCHC: 34.3 g/dL (ref 30.0–36.0)
MCV: 95.5 fl (ref 78.0–100.0)
Monocytes Absolute: 0.6 10*3/uL (ref 0.1–1.0)
Monocytes Relative: 8.9 % (ref 3.0–12.0)
NEUTROS ABS: 3.6 10*3/uL (ref 1.4–7.7)
NEUTROS PCT: 54 % (ref 43.0–77.0)
PLATELETS: 278 10*3/uL (ref 150.0–400.0)
RBC: 4.74 Mil/uL (ref 4.22–5.81)
RDW: 12.9 % (ref 11.5–15.5)
WBC: 6.7 10*3/uL (ref 4.0–10.5)

## 2016-04-13 LAB — POC URINALSYSI DIPSTICK (AUTOMATED)
BILIRUBIN UA: NEGATIVE
Glucose, UA: NEGATIVE
Ketones, UA: NEGATIVE
LEUKOCYTES UA: NEGATIVE
NITRITE UA: NEGATIVE
PH UA: 6
Protein, UA: NEGATIVE
Spec Grav, UA: 1.02
UROBILINOGEN UA: 0.2

## 2016-04-13 LAB — HEPATIC FUNCTION PANEL
ALK PHOS: 67 U/L (ref 39–117)
ALT: 23 U/L (ref 0–53)
AST: 25 U/L (ref 0–37)
Albumin: 4.2 g/dL (ref 3.5–5.2)
BILIRUBIN DIRECT: 0.1 mg/dL (ref 0.0–0.3)
Total Bilirubin: 0.3 mg/dL (ref 0.2–1.2)
Total Protein: 6.9 g/dL (ref 6.0–8.3)

## 2016-04-13 LAB — LIPID PANEL
CHOL/HDL RATIO: 4
Cholesterol: 167 mg/dL (ref 0–200)
HDL: 42 mg/dL (ref >39.00)
LDL Cholesterol: 92 mg/dL (ref 0–99)
NONHDL: 125.46
Triglycerides: 166 mg/dL — ABNORMAL HIGH (ref 0.0–149.0)
VLDL: 33.2 mg/dL (ref 0.0–40.0)

## 2016-04-13 LAB — PSA: PSA: 1.1 ng/mL (ref 0.10–4.00)

## 2016-04-13 LAB — TSH: TSH: 1.64 u[IU]/mL (ref 0.35–4.50)

## 2016-04-20 ENCOUNTER — Ambulatory Visit (INDEPENDENT_AMBULATORY_CARE_PROVIDER_SITE_OTHER): Payer: BLUE CROSS/BLUE SHIELD | Admitting: Family Medicine

## 2016-04-20 ENCOUNTER — Encounter: Payer: Self-pay | Admitting: Family Medicine

## 2016-04-20 VITALS — BP 100/62 | HR 66 | Temp 97.9°F | Ht 70.25 in | Wt 212.6 lb

## 2016-04-20 DIAGNOSIS — R319 Hematuria, unspecified: Secondary | ICD-10-CM

## 2016-04-20 DIAGNOSIS — Z23 Encounter for immunization: Secondary | ICD-10-CM

## 2016-04-20 DIAGNOSIS — E785 Hyperlipidemia, unspecified: Secondary | ICD-10-CM | POA: Diagnosis not present

## 2016-04-20 DIAGNOSIS — Z0001 Encounter for general adult medical examination with abnormal findings: Secondary | ICD-10-CM | POA: Diagnosis not present

## 2016-04-20 NOTE — Patient Instructions (Addendum)
As always- would encourage you to quit smoking  Recommend 150 minutes exercise a week (consecutive blocks where you get heart rate up for at least 30 minutes- so sitting on the golf cart doesn't help as much! )  Drop off urine before you leave  Flu shot before you leave  Roselyn Reef will help you sign up for mychart

## 2016-04-20 NOTE — Progress Notes (Signed)
Phone: [phone]  Subjective:  Patient presents today for their annual physical. Chief complaint-noted.   See problem oriented charting- ROS- full  review of systems was completed and negative except for: intermittent palpitations that resolve with coughing  The following were reviewed and entered/updated in epic: Past Medical History:  Diagnosis Date  . ADHD (attention deficit hyperactivity disorder)    no treatment  . Allergy   . Hematuria   . Hyperlipidemia   . Skin cancer    nose- radiation only. Cared for regularly Dr. Wilhemina Bonito.  . Tremor    benign, essential   Patient Active Problem List   Diagnosis Date Noted  . Tobacco use disorder 02/23/2009    Priority: High  . Anxiety state 01/03/2016    Priority: Medium  . Palpitations 06/05/2013    Priority: Medium  . Hyperlipidemia, mild 12/10/2007    Priority: Medium  . Benign essential tremor 12/10/2007    Priority: Medium  . Reactive airway disease 08/03/2015    Priority: Low  . Erectile dysfunction 03/05/2014    Priority: Low  . Microscopic hematuria 12/10/2007    Priority: Low  . Allergic rhinitis 01/04/2007    Priority: Low   Past Surgical History:  Procedure Laterality Date  . [place]   right  . left heel surgery     Dr. Noemi Chapel.around 2000    Family History  Problem Relation Age of Onset  . Diabetes Mother   . Congestive Heart Failure Mother     passed at 25  . Aortic aneurysm Mother   . Hypertension Mother   . Stroke Mother     27  . Breast cancer Father 55  . Diabetes Brother   . Thyroid cancer Brother   . Other Brother     adopted  . Colon cancer Cousin     Medications- reviewed and updated Current Outpatient Prescriptions  Medication Sig Dispense Refill  . aspirin EC 81 MG tablet Take 1 tablet (81 mg total) by mouth daily.    . cetirizine (ZYRTEC) 10 MG chewable tablet Chew 10 mg by mouth daily as needed for allergies.     . diazepam (VALIUM) 5 MG tablet Take 1 tablet (5  mg total) by mouth as needed. For travel 30 tablet 3  . fish oil-omega-3 fatty acids 1000 MG capsule Take 2 g by mouth daily.      Marland Kitchen ibuprofen (ADVIL,MOTRIN) 200 MG tablet Take 600-800 mg by mouth every 6 (six) hours as needed for moderate pain.    . Multiple Vitamin (MULTIVITAMIN) tablet Take 1 tablet by mouth daily.      . simvastatin (ZOCOR) 10 MG tablet TAKE ONE TABLET AT BEDTIME. 95 tablet 3  . tadalafil (CIALIS) 20 MG tablet TAKE 1 TABLET ONCE DAILY AS NEEDED FOR ERECTILE DYSFUNCTION. 30 tablet 5  . zolpidem (AMBIEN) 5 MG tablet Take 1 tablet (5 mg total) by mouth at bedtime as needed for sleep. 2 tablet 0   No current facility-administered medications for this visit.     Allergies-reviewed and updated No Known Allergies  Social History   Social History  . Marital status: Married    Spouse name: N/A  . Number of children: N/A  . Years of education: N/A   Social History Main Topics  . Smoking status: Current Some Day Smoker    Years: 2.00    Types: Cigars  . Smokeless tobacco: Never Used  . Alcohol use 1.2 oz/week    2 Cans of  beer per week     Comment: social only  . Drug use: No  . Sexual activity: Not Asked   Other Topics Concern  . None   Social History Narrative   Married 1984. 3 children- 29/28/27- in 2017. 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 Enjoys his work- 36 years in 2017.       Hobbies: golf, enjoys cigars          Objective: BP 100/62 (BP Location: Left Arm, Patient Position: Sitting, Cuff Size: Large)   Pulse 66   Temp 97.9 F (36.6 C) (Oral)   Ht 5' 10.25" (1.784 m)   Wt 212 lb 9.6 oz (96.4 kg)   SpO2 95%   BMI 30.29 kg/m  Gen: NAD, resting comfortably HEENT: Mucous membranes are moist. Oropharynx normal Neck: no thyromegaly CV: RRR no murmurs rubs or gallops Lungs: CTAB no crackles, wheeze, rhonchi Abdomen: soft/nontender/nondistended/normal bowel sounds. No rebound or  guarding.  Ext: no edema Skin: warm, dry Neuro: grossly normal, moves all extremities, PERRLA Rectal: normal tone, diffusely enlarged prostate, no masses or tenderness  EKG; Fair amount of movement. Rate 58, sinus bradycardia. Normal axis, normal intervals, no significant st or t wave changes. No signifcant changes when compared to 02/01/15 except for movement noted  Assessment/Plan:  60 y.o. male presenting for annual physical.  Health Maintenance counseling: 1. Anticipatory guidance: Patient counseled regarding regular dental exams, eye exams- no issues, wearing seatbelts.  2. Risk factor reduction:  Advised patient of need for regular exercise- would recommend 150 minutes a week (considering walking) and diet rich and fruits and vegetables to reduce risk of heart attack and stroke. Wants to get weight closer to 190.  3. Immunizations/screenings/ancillary studies Immunization History  Administered Date(s) Administered  . Influenza Split 01/24/2011  . Influenza Whole 03/18/2009, 03/24/2010  . Influenza, Seasonal, Injecte, Preservative Fre 04/22/2012  . Influenza,inj,Quad PF,36+ Mos 03/04/2013, 03/23/2015, 04/20/2016  . Influenza-Unspecified 02/26/2014  . Td 05/08/2002  . Tdap 03/04/2013   Health Maintenance Due  Topic Date Due  . Hepatitis C Screening - future labs December 20, 1955  . INFLUENZA VACCINE - today 12/07/2015   4. Prostate cancer screening- PSA up slightly, rectal exam benign- will follow trend yearly.  BPH on exam and nocturia 1-2x a night Lab Results  Component Value Date   PSA 1.10 04/13/2016   PSA 0.80 03/11/2015   PSA 0.75 02/26/2014   5. Colon cancer screening - colonoscopy 03/15/2016 with 10 year follow planned 6. Skin cancer screening- Dr. Wilhemina Bonito dermatology  Status of chronic or acute concerns  Tobacco use disorder - Cigar smoking twice a week- not willing to quit but wants to continue with yearly CXR.   Palpitations due to PACs- stable at this time- has been  really low lately, declines beta blocker  HLD- reasonable control on simvastatin 10mg . Also on aspirin 81mg   Tremor head twitch- since 20s- not better with alcohol. Not worsening. Does not want to pursue workup  cialis helps with ED  Valium for travel with anxiety- travels to see [place] overseas. ambien for the longer 8-9 hour flights #2.   Urine today to follow up on possible hematuria  Return in about 1 year (around 04/20/2017) for physical.  Orders Placed This Encounter  Procedures  . Flu Vaccine QUAD 36+ mos IM  . Urine Microscopic Only  . EKG 12-Lead    Order Specific Question:   Where  should this test be performed    Answer:   Other   Return precautions advised.   Garret Reddish, MD

## 2016-04-20 NOTE — Progress Notes (Signed)
Pre visit review using our clinic review tool, if applicable. No additional management support is needed unless otherwise documented below in the visit note. 

## 2016-04-21 LAB — URINALYSIS, MICROSCOPIC ONLY

## 2016-04-25 ENCOUNTER — Encounter: Payer: Self-pay | Admitting: Family Medicine

## 2016-06-13 ENCOUNTER — Encounter: Payer: Self-pay | Admitting: Family Medicine

## 2016-09-21 ENCOUNTER — Encounter: Payer: Self-pay | Admitting: Family Medicine

## 2016-09-21 ENCOUNTER — Ambulatory Visit (INDEPENDENT_AMBULATORY_CARE_PROVIDER_SITE_OTHER): Payer: BLUE CROSS/BLUE SHIELD | Admitting: Family Medicine

## 2016-09-21 DIAGNOSIS — J301 Allergic rhinitis due to pollen: Secondary | ICD-10-CM | POA: Diagnosis not present

## 2016-09-21 MED ORDER — MONTELUKAST SODIUM 10 MG PO TABS: 10.0000 mg | ORAL_TABLET | Freq: Every day | ORAL | 1 refills | Status: DC

## 2016-09-21 NOTE — Assessment & Plan Note (Signed)
S:Zyrtec started early April. Has been taking for several years. Always has seasonal allergies around this time.   Played golf fri and Saturday. sinus congestion started on Sunday when working in yard. Cut lawn on Monday and intensified. Tuesday started feeling very fatigued. Sinus congestion, cough, sneezing, watery, itchy eyes and runny nose.   Sick contacts:Assistant in office had respiratory infection.  Prior treatment responses: very irritable on prednisone and gains weight. Nose bleeds even without flonase and worsens on it  A/P: poorly controlled allergic rhinitis. Does not tolerate prednisone due to irritability. High risk nose bleeds if using flonase. We opted to add Singulair for 1-2 months. He will contact me if not better in 2-3 weeks and would add prednisone. Should see [place] back sooner for new or worsening symptoms.

## 2016-09-21 NOTE — Progress Notes (Signed)
Subjective:  Dakota Barker is a 61 y.o. year ol very pleasant male patient who presents for/with See problem oriented charting ROS-  Not shortness of breath, no fever. No sinus pain. No wheezing. Occasionally productive cough.  Past Medical History-  Patient Active Problem List   Diagnosis Date Noted  . Tobacco use disorder 02/23/2009    Priority: High  . Anxiety state 01/03/2016    Priority: Medium  . Palpitations 06/05/2013    Priority: Medium  . Hyperlipidemia, mild 12/10/2007    Priority: Medium  . Benign essential tremor 12/10/2007    Priority: Medium  . Reactive airway disease 08/03/2015    Priority: Low  . Erectile dysfunction 03/05/2014    Priority: Low  . Microscopic hematuria 12/10/2007    Priority: Low  . Allergic rhinitis 01/04/2007    Priority: Low    Medications- reviewed and updated Current Outpatient Prescriptions  Medication Sig Dispense Refill  . aspirin EC 81 MG tablet Take 1 tablet (81 mg total) by mouth daily.    . cetirizine (ZYRTEC) 10 MG chewable tablet Chew 10 mg by mouth daily as needed for allergies.     . diazepam (VALIUM) 5 MG tablet Take 1 tablet (5 mg total) by mouth as needed. For travel 30 tablet 3  . fish oil-omega-3 fatty acids 1000 MG capsule Take 2 g by mouth daily.      Marland Kitchen ibuprofen (ADVIL,MOTRIN) 200 MG tablet Take 600-800 mg by mouth every 6 (six) hours as needed for moderate pain.    . Multiple Vitamin (MULTIVITAMIN) tablet Take 1 tablet by mouth daily.      . simvastatin (ZOCOR) 10 MG tablet TAKE ONE TABLET AT BEDTIME. 95 tablet 3  . tadalafil (CIALIS) 20 MG tablet TAKE 1 TABLET ONCE DAILY AS NEEDED FOR ERECTILE DYSFUNCTION. 30 tablet 5  . zolpidem (AMBIEN) 5 MG tablet Take 1 tablet (5 mg total) by mouth at bedtime as needed for sleep. 2 tablet 0  . montelukast (SINGULAIR) 10 MG tablet Take 1 tablet (10 mg total) by mouth at bedtime. 30 tablet 1   No current facility-administered medications for this visit.     Objective: BP  118/68 (BP Location: Left Arm, Patient Position: Sitting, Cuff Size: Large)   Pulse 66   Temp 98.5 F (36.9 C) (Oral)   Ht 5' 10.25" (1.784 m)   Wt 208 lb 12.8 oz (94.7 kg)   SpO2 94%   BMI 29.75 kg/m  Gen: NAD, resting comfortably TM normal, nasal turbinates pale but swollen with clear rhinorrhea, light amount of drainage in pharynx- otherwise noraml No lymphadenopathy CV: RRR no murmurs rubs or gallops Lungs: CTAB no crackles, wheeze, rhonchi Abdomen: soft/nontender/nondistended/normal bowel sounds. No rebound or guarding.  Ext: no edema Skin: warm, dry  Assessment/Plan:  Allergic rhinitis S:Zyrtec started early April. Has been taking for several years. Always has seasonal allergies around this time.   Played golf fri and Saturday. sinus congestion started on Sunday when working in yard. Cut lawn on Monday and intensified. Tuesday started feeling very fatigued. Sinus congestion, cough, sneezing, watery, itchy eyes and runny nose.   Sick contacts:Assistant in office had respiratory infection.  Prior treatment responses: very irritable on prednisone and gains weight. Nose bleeds even without flonase and worsens on it  A/P: poorly controlled allergic rhinitis. Does not tolerate prednisone due to irritability. High risk nose bleeds if using flonase. We opted to add Singulair for 1-2 months. He will contact me if not better in 2-3 weeks  and would add prednisone. Should see [place] back sooner for new or worsening symptoms.   Meds ordered this encounter  Medications  . montelukast (SINGULAIR) 10 MG tablet    Sig: Take 1 tablet (10 mg total) by mouth at bedtime.    Dispense:  30 tablet    Refill:  1  established worsening with medication management  Return precautions advised.  Garret Reddish, MD

## 2016-09-21 NOTE — Patient Instructions (Signed)
I do think this is allergy related  Start singulair daily. Would continue the zyrtec.   Do this for 2-3 weeks minimum but have enough to get you through June.   Let us know if not improving- we can do last ditch effort prednisone if needed

## 2016-10-03 ENCOUNTER — Encounter: Payer: Self-pay | Admitting: Family Medicine

## 2016-10-03 DIAGNOSIS — F17290 Nicotine dependence, other tobacco product, uncomplicated: Secondary | ICD-10-CM

## 2016-10-03 DIAGNOSIS — R05 Cough: Secondary | ICD-10-CM

## 2016-10-03 DIAGNOSIS — R059 Cough, unspecified: Secondary | ICD-10-CM

## 2016-10-04 ENCOUNTER — Ambulatory Visit (INDEPENDENT_AMBULATORY_CARE_PROVIDER_SITE_OTHER)
Admission: RE | Admit: 2016-10-04 | Discharge: 2016-10-04 | Disposition: A | Discharge status: Home / Self Care | Payer: BLUE CROSS/BLUE SHIELD | Source: Ambulatory Visit | Attending: Family Medicine | Admitting: Family Medicine

## 2016-10-04 DIAGNOSIS — F17290 Nicotine dependence, other tobacco product, uncomplicated: Secondary | ICD-10-CM

## 2016-10-04 DIAGNOSIS — R059 Cough, unspecified: Secondary | ICD-10-CM

## 2016-10-04 DIAGNOSIS — R05 Cough: Secondary | ICD-10-CM

## 2016-10-19 ENCOUNTER — Encounter: Payer: Self-pay | Admitting: Family Medicine

## 2016-10-19 MED ORDER — PREDNISONE 20 MG PO TABS: ORAL_TABLET | ORAL | 0 refills | Status: DC

## 2016-10-30 ENCOUNTER — Encounter: Payer: Self-pay | Admitting: Family Medicine

## 2016-10-30 MED ORDER — AMOXICILLIN-POT CLAVULANATE 875-125 MG PO TABS: 1.0000 | ORAL_TABLET | Freq: Two times a day (BID) | ORAL | 0 refills | Status: DC

## 2017-01-29 DIAGNOSIS — H6523 Chronic serous otitis media, bilateral: Secondary | ICD-10-CM | POA: Diagnosis not present

## 2017-01-29 DIAGNOSIS — H6993 Unspecified Eustachian tube disorder, bilateral: Secondary | ICD-10-CM | POA: Diagnosis not present

## 2017-02-05 ENCOUNTER — Other Ambulatory Visit: Payer: Self-pay | Admitting: Family Medicine

## 2017-02-05 DIAGNOSIS — E785 Hyperlipidemia, unspecified: Secondary | ICD-10-CM

## 2017-02-09 ENCOUNTER — Other Ambulatory Visit: Payer: Self-pay | Admitting: Family Medicine

## 2017-02-09 ENCOUNTER — Other Ambulatory Visit: Payer: Self-pay | Admitting: *Deleted

## 2017-02-09 DIAGNOSIS — E785 Hyperlipidemia, unspecified: Secondary | ICD-10-CM

## 2017-02-09 MED ORDER — SIMVASTATIN 10 MG PO TABS: ORAL_TABLET | ORAL | 0 refills | Status: DC

## 2017-03-09 ENCOUNTER — Telehealth: Payer: Self-pay | Admitting: Family Medicine

## 2017-03-09 NOTE — Telephone Encounter (Signed)
No orders for fasitng labs on 04/06/2017 and chest x-ray.   Last chest x-ray was 05/30th, but says there was an issue with it.  Please call patient to discuss chest x-ray.  Ty,  -LL

## 2017-03-09 NOTE — Telephone Encounter (Signed)
We can discuss at time of visit- we can do x-ray that day  For labs we have had issues with previsit labs being covered particularly for [employer] so would prefer to do those on day of visit or after instead of before

## 2017-03-13 NOTE — Telephone Encounter (Signed)
Called and spoke to patient who stated that he is [facility] now not [facility]. He is going to call and see if [facility] will cover labs prior to visit. He will send a My Chart message to let us know.

## 2017-04-02 ENCOUNTER — Telehealth: Payer: Self-pay | Admitting: Radiology

## 2017-04-02 DIAGNOSIS — F17200 Nicotine dependence, unspecified, uncomplicated: Secondary | ICD-10-CM

## 2017-04-02 DIAGNOSIS — Z125 Encounter for screening for malignant neoplasm of prostate: Secondary | ICD-10-CM

## 2017-04-02 DIAGNOSIS — E785 Hyperlipidemia, unspecified: Secondary | ICD-10-CM

## 2017-04-02 NOTE — Telephone Encounter (Signed)
Last year labs on 04/23/17. Looks like he changed insurances so may not matter that lipid panel is being done 7 days early. Just may want to let him know this when he comes in.   Labs placed

## 2017-04-02 NOTE — Telephone Encounter (Signed)
I will let him know. Thank you.

## 2017-04-02 NOTE — Telephone Encounter (Signed)
Please place lab orders for patients lab appointment on 04/06/17. Thank you.

## 2017-04-06 ENCOUNTER — Other Ambulatory Visit (INDEPENDENT_AMBULATORY_CARE_PROVIDER_SITE_OTHER): Payer: 59

## 2017-04-06 DIAGNOSIS — F17200 Nicotine dependence, unspecified, uncomplicated: Secondary | ICD-10-CM | POA: Diagnosis not present

## 2017-04-06 DIAGNOSIS — E785 Hyperlipidemia, unspecified: Secondary | ICD-10-CM | POA: Diagnosis not present

## 2017-04-06 DIAGNOSIS — Z125 Encounter for screening for malignant neoplasm of prostate: Secondary | ICD-10-CM | POA: Diagnosis not present

## 2017-04-06 DIAGNOSIS — R739 Hyperglycemia, unspecified: Secondary | ICD-10-CM

## 2017-04-06 LAB — LIPID PANEL
CHOL/HDL RATIO: 4
Cholesterol: 172 mg/dL (ref 0–200)
HDL: 38.8 mg/dL — ABNORMAL LOW (ref >39.00)
LDL CALC: 109 mg/dL — AB (ref 0–99)
NonHDL: 132.78
Triglycerides: 121 mg/dL (ref 0.0–149.0)
VLDL: 24.2 mg/dL (ref 0.0–40.0)

## 2017-04-06 LAB — URINALYSIS
Bilirubin Urine: NEGATIVE
Hgb urine dipstick: NEGATIVE
KETONES UR: NEGATIVE
Leukocytes, UA: NEGATIVE
Nitrite: NEGATIVE
SPECIFIC GRAVITY, URINE: 1.015 (ref 1.000–1.030)
TOTAL PROTEIN, URINE-UPE24: NEGATIVE
URINE GLUCOSE: NEGATIVE
UROBILINOGEN UA: 0.2 (ref 0.0–1.0)
pH: 8 (ref 5.0–8.0)

## 2017-04-06 LAB — COMPREHENSIVE METABOLIC PANEL
ALT: 21 U/L (ref 0–53)
AST: 22 U/L (ref 0–37)
Albumin: 4.3 g/dL (ref 3.5–5.2)
Alkaline Phosphatase: 57 U/L (ref 39–117)
BILIRUBIN TOTAL: 0.6 mg/dL (ref 0.2–1.2)
BUN: 17 mg/dL (ref 6–23)
CALCIUM: 9.8 mg/dL (ref 8.4–10.5)
CHLORIDE: 106 meq/L (ref 96–112)
CO2: 31 meq/L (ref 19–32)
CREATININE: 1.14 mg/dL (ref 0.40–1.50)
GFR: 69.52 mL/min (ref >60.00)
Glucose, Bld: 102 mg/dL — ABNORMAL HIGH (ref 70–99)
Potassium: 4.8 mEq/L (ref 3.5–5.1)
SODIUM: 142 meq/L (ref 135–145)
Total Protein: 6.8 g/dL (ref 6.0–8.3)

## 2017-04-06 LAB — CBC
HEMATOCRIT: 46.7 % (ref 39.0–52.0)
Hemoglobin: 15.8 g/dL (ref 13.0–17.0)
MCHC: 33.7 g/dL (ref 30.0–36.0)
MCV: 97.5 fl (ref 78.0–100.0)
Platelets: 264 10*3/uL (ref 150.0–400.0)
RBC: 4.79 Mil/uL (ref 4.22–5.81)
RDW: 12.7 % (ref 11.5–15.5)
WBC: 5.6 10*3/uL (ref 4.0–10.5)

## 2017-04-06 LAB — HEMOGLOBIN A1C: Hgb A1c MFr Bld: 5.6 % (ref 4.6–6.5)

## 2017-04-06 LAB — PSA: PSA: 1.15 ng/mL (ref 0.10–4.00)

## 2017-04-06 NOTE — Addendum Note (Signed)
Addended by: Kayren Eaves T on: 04/06/2017 08:01 AM   Modules accepted: Orders

## 2017-04-12 ENCOUNTER — Ambulatory Visit (INDEPENDENT_AMBULATORY_CARE_PROVIDER_SITE_OTHER): Payer: 59

## 2017-04-12 ENCOUNTER — Ambulatory Visit (INDEPENDENT_AMBULATORY_CARE_PROVIDER_SITE_OTHER): Payer: 59 | Admitting: Family Medicine

## 2017-04-12 ENCOUNTER — Encounter: Payer: Self-pay | Admitting: Family Medicine

## 2017-04-12 VITALS — BP 108/70 | HR 64 | Temp 97.9°F | Ht 70.25 in | Wt 216.2 lb

## 2017-04-12 DIAGNOSIS — J301 Allergic rhinitis due to pollen: Secondary | ICD-10-CM | POA: Diagnosis not present

## 2017-04-12 DIAGNOSIS — F4323 Adjustment disorder with mixed anxiety and depressed mood: Secondary | ICD-10-CM | POA: Diagnosis not present

## 2017-04-12 DIAGNOSIS — R002 Palpitations: Secondary | ICD-10-CM

## 2017-04-12 DIAGNOSIS — E785 Hyperlipidemia, unspecified: Secondary | ICD-10-CM | POA: Diagnosis not present

## 2017-04-12 DIAGNOSIS — Z0000 Encounter for general adult medical examination without abnormal findings: Secondary | ICD-10-CM

## 2017-04-12 DIAGNOSIS — F411 Generalized anxiety disorder: Secondary | ICD-10-CM | POA: Diagnosis not present

## 2017-04-12 DIAGNOSIS — R05 Cough: Secondary | ICD-10-CM

## 2017-04-12 DIAGNOSIS — Z720 Tobacco use: Secondary | ICD-10-CM

## 2017-04-12 DIAGNOSIS — Z23 Encounter for immunization: Secondary | ICD-10-CM

## 2017-04-12 DIAGNOSIS — R059 Cough, unspecified: Secondary | ICD-10-CM

## 2017-04-12 MED ORDER — TADALAFIL 20 MG PO TABS: ORAL_TABLET | ORAL | 5 refills | Status: DC

## 2017-04-12 MED ORDER — DIAZEPAM 5 MG PO TABS: 5.0000 mg | ORAL_TABLET | ORAL | 3 refills | Status: DC | PRN

## 2017-04-12 MED ORDER — SIMVASTATIN 20 MG PO TABS: 20.0000 mg | ORAL_TABLET | Freq: Every day | ORAL | 3 refills | Status: DC

## 2017-04-12 MED ORDER — SIMVASTATIN 20 MG PO TABS: 10.0000 mg | ORAL_TABLET | Freq: Every day | ORAL | 3 refills | Status: DC

## 2017-04-12 NOTE — Assessment & Plan Note (Signed)
Valium for travel - anxiety with this- sees [place] overseas. Uses ambien if longer than 8-9 hour flight- did not help him so will stop

## 2017-04-12 NOTE — Assessment & Plan Note (Signed)
PACs- cause some palpitations- stable overall. Does not want beta blocker

## 2017-04-12 NOTE — Patient Instructions (Signed)
Increase simvastatin to 20mg   Get chest x-ray and then you are free to go after that  I love your weight goal 195-200 over next year

## 2017-04-12 NOTE — Assessment & Plan Note (Signed)
HLD- on simvastatin 10mg  and asa 81mg  for primary prevention. LDL was at 109 and discussed bumping dose to 20mg  - will do this in addition to weight loss Lab Results  Component Value Date   CHOL 172 04/06/2017   HDL 38.80 (L) 04/06/2017   LDLCALC 109 (H) 04/06/2017   LDLDIRECT 87.8 07/04/2010   TRIG 121.0 04/06/2017   CHOLHDL 4 04/06/2017

## 2017-04-12 NOTE — Progress Notes (Signed)
Phone: [phone]  Subjective:  Patient presents today for their annual physical. Chief complaint-noted.   See problem oriented charting- ROS- full  review of systems was completed and negative except for: cough  The following were reviewed and entered/updated in epic: Past Medical History:  Diagnosis Date  . ADHD (attention deficit hyperactivity disorder)    no treatment  . Allergy   . Hematuria   . Hyperlipidemia   . Skin cancer    nose- radiation only. Cared for regularly Dr. Wilhemina Bonito.  . Tremor    benign, essential   Patient Active Problem List   Diagnosis Date Noted  . Tobacco use disorder 02/23/2009    Priority: High  . Anxiety state 01/03/2016    Priority: Medium  . Palpitations 06/05/2013    Priority: Medium  . Hyperlipidemia, mild 12/10/2007    Priority: Medium  . Benign essential tremor 12/10/2007    Priority: Medium  . Reactive airway disease 08/03/2015    Priority: Low  . Erectile dysfunction 03/05/2014    Priority: Low  . Microscopic hematuria 12/10/2007    Priority: Low  . Allergic rhinitis 01/04/2007    Priority: Low   Past Surgical History:  Procedure Laterality Date  . [place]   right  . left heel surgery     Dr. Noemi Chapel.around 2000    Family History  Problem Relation Age of Onset  . Diabetes Mother   . Congestive Heart Failure Mother        passed at 30  . Aortic aneurysm Mother   . Hypertension Mother   . Stroke Mother        50  . Breast cancer Father 9  . Diabetes Brother   . Thyroid cancer Brother   . Other Brother        adopted  . Colon cancer Cousin     Medications- reviewed and updated Current Outpatient Medications  Medication Sig Dispense Refill  . aspirin EC 81 MG tablet Take 1 tablet (81 mg total) by mouth daily.    . diazepam (VALIUM) 5 MG tablet Take 1 tablet (5 mg total) by mouth as needed. For travel 30 tablet 3  . fish oil-omega-3 fatty acids 1000 MG capsule Take 2 g by mouth daily.      Marland Kitchen  ibuprofen (ADVIL,MOTRIN) 200 MG tablet Take 600-800 mg by mouth every 6 (six) hours as needed for moderate pain.    . montelukast (SINGULAIR) 10 MG tablet Take 1 tablet (10 mg total) by mouth at bedtime. 30 tablet 1  . Multiple Vitamin (MULTIVITAMIN) tablet Take 1 tablet by mouth daily.      . simvastatin (ZOCOR) 10 MG tablet Take 1 tablets (10 mg total) by mouth at bedtime. 90 tablet 3  . tadalafil (CIALIS) 20 MG tablet TAKE 1 TABLET ONCE DAILY AS NEEDED FOR ERECTILE DYSFUNCTION. 30 tablet 5  . cetirizine (ZYRTEC) 10 MG chewable tablet Chew 10 mg by mouth daily as needed for allergies.      Allergies-reviewed and updated No Known Allergies  Social History   Socioeconomic History  . Marital status: Married    Spouse name: None  . Number of children: None  . Years of education: None  . Highest education level: None  Social Needs  . Financial resource strain: None  . Food insecurity - worry: None  . Food insecurity - inability: None  . Transportation needs - medical: None  . Transportation needs - non-medical: None  Occupational History  .  None  Tobacco Use  . Smoking status: Current Some Day Smoker    Years: 2.00    Types: Cigars  . Smokeless tobacco: Never Used  Substance and Sexual Activity  . Alcohol use: Yes    Alcohol/week: 1.2 oz    Types: 2 Cans of beer per week    Comment: social only  . Drug use: No  . Sexual activity: None  Other Topics Concern  . None  Social History Narrative   Married 1984. 3 children- 29/28/27- in 2017. 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 Enjoys his work- 36 years in 2017.       Hobbies: golf, enjoys cigars       Objective: BP 108/70 (BP Location: Left Arm, Patient Position: Sitting, Cuff Size: Large)   Pulse 64   Temp 97.9 F (36.6 C) (Oral)   Ht 5' 10.25" (1.784 m)   Wt 216 lb 3.2 oz (98.1 kg)   SpO2 97%   BMI 30.80 kg/m  Gen: NAD, resting comfortably HEENT:  Mucous membranes are moist. Oropharynx normal Neck: no thyromegaly CV: RRR no murmurs rubs or gallops Lungs: CTAB no crackles, wheeze, rhonchi Abdomen: soft/nontender/nondistended/normal bowel sounds. No rebound or guarding. overweight Ext: no edema Skin: warm, dry Neuro: grossly normal, moves all extremities, PERRLA Rectal: normal tone, diffusely enlarged prostate, no masses or tenderness  EKG: Sinus bradycardia with rate 59. Normal axis, normal intervals, no hypertrophy. Less movement when compared to EKG from last CPE  Assessment/Plan:  61 y.o. male presenting for annual physical.  Health Maintenance counseling: 1. Anticipatory guidance: Patient counseled regarding regular dental exams -q6 months, eye exams -no issues-advised to consider follow up- may see Dr. Gershon Crane, wearing seatbelts.  2. Risk factor reduction:  Advised patient of need for regular exercise and diet rich and fruits and vegetables to reduce risk of heart attack and stroke. Exercise- has really fallen off lately since daylight savings time- knows he has to pick this up. Diet-sitting around eating more- knows he needs to improve diet and lose some lbs- target 195-200 per him- great goal.  Wt Readings from Last 3 Encounters:  04/12/17 216 lb 3.2 oz (98.1 kg)  09/21/16 208 lb 12.8 oz (94.7 kg)  04/20/16 212 lb 9.6 oz (96.4 kg)  3. Immunizations/screenings/ancillary studies- discussed shingrix availability issues. Flu shot given today. HCV planned next years labs Immunization History  Administered Date(s) Administered  . Influenza Split 01/24/2011  . Influenza Whole 03/18/2009, 03/24/2010  . Influenza, Seasonal, Injecte, Preservative Fre 04/22/2012  . Influenza,inj,Quad PF,6+ Mos 03/04/2013, 03/23/2015, 04/20/2016, 04/12/2017  . Influenza-Unspecified 02/26/2014  . Td 05/08/2002  . Tdap 03/04/2013   4. Prostate cancer screening-   PSA largely stable from last year- continue to trend yearly. BPH on exam, still with  nocturia 1-2x a night Lab Results  Component Value Date   PSA 1.15 04/06/2017   PSA 1.10 04/13/2016   PSA 0.80 03/11/2015   5. Colon cancer screening - colonoscopy 03/2016 with 10 year follow up 6. Skin cancer screening- Dr. Wilhemina Bonito dermatology yearly- advised to call for follow up.advised regular sunscreen use. Denies worrisome, changing, or new skin lesions.   Status of chronic or acute concerns   HCV with labs next year . Will plan on a1c again  Tobacco use disorder- still smoking cigars twice a week and not willing to quit-Really helps with stress relief. . Once again requests CXR and completed.  CBG 102- at risk for diabetes . Weight up 8 lbs from last visit. Luckily a1c not elevated  ED- cialis helping  Urine normal this year. ? Hematuria in past but not on microscopic  Hyperlipidemia, mild HLD- on simvastatin 10mg  and asa 81mg  for primary prevention. LDL was at 109 and discussed bumping dose to 20mg  - will do this in addition to weight loss Lab Results  Component Value Date   CHOL 172 04/06/2017   HDL 38.80 (L) 04/06/2017   LDLCALC 109 (H) 04/06/2017   LDLDIRECT 87.8 07/04/2010   TRIG 121.0 04/06/2017   CHOLHDL 4 04/06/2017     Allergic rhinitis Long term Cough- does take zyrtec and singulair for allergies. flonase causes bloody nose so didn't want to take long term but later was placed on flonase per Dr. Lucia Gaskins due to effusion- no bloody nose thankfully  Will get CXR as well with smoking history  Anxiety state Valium for travel - anxiety with this- sees [place] overseas. Uses ambien if longer than 8-9 hour flight- did not help him so will stop  Palpitations PACs- cause some palpitations- stable overall. Does not want beta blocker  Return in about 1 year (around 04/12/2018) for physical.  Orders Placed This Encounter  Procedures  . DG Chest 2 View    Standing Status:   Future    Number of Occurrences:   1    Standing Expiration Date:   06/13/2018    Order  Specific Question:   Reason for Exam (SYMPTOM  OR DIAGNOSIS REQUIRED)    Answer:   cough- suspect allergy related- evaluate potential malignancy, cigar smoker    Order Specific Question:   Preferred imaging location?    Answer:   [facility]  . Flu Vaccine QUAD 36+ mos IM  . EKG 12-Lead    Order Specific Question:   Where should this test be performed    Answer:   Other    Meds ordered this encounter  Medications  . simvastatin (ZOCOR) 20 MG tablet    Sig: Take 0.5 tablets (10 mg total) by mouth at bedtime.    Dispense:  90 tablet    Refill:  3  . tadalafil (CIALIS) 20 MG tablet    Sig: TAKE 1 TABLET ONCE DAILY AS NEEDED FOR ERECTILE DYSFUNCTION.    Dispense:  30 tablet    Refill:  5  . diazepam (VALIUM) 5 MG tablet    Sig: Take 1 tablet (5 mg total) by mouth as needed. For travel    Dispense:  30 tablet    Refill:  3    Return precautions advised.  Garret Reddish, MD

## 2017-04-12 NOTE — Assessment & Plan Note (Signed)
Long term Cough- does take zyrtec and singulair for allergies. flonase causes bloody nose so didn't want to take long term but later was placed on flonase per Dr. Lucia Gaskins due to effusion- no bloody nose thankfully  Will get CXR as well with smoking history

## 2017-05-24 DIAGNOSIS — C44311 Basal cell carcinoma of skin of nose: Secondary | ICD-10-CM | POA: Diagnosis not present

## 2017-05-31 ENCOUNTER — Encounter: Payer: Self-pay | Admitting: Radiation Oncology

## 2017-06-01 ENCOUNTER — Encounter: Payer: Self-pay | Admitting: Radiation Oncology

## 2017-06-01 NOTE — Progress Notes (Signed)
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    ICD-10-CM   1. Basal cell carcinoma (BCC) of nasal tip C44.311     HISTORY OF PRESENT ILLNESS: Dakota Barker is a 62 y.o. male seen at the request of Dr. 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  The patient has a history of a hemangioma and has been treated with ice cryotherapy as an infant. In the 1990s, he was found to have a basal cell carcinoma on the tip of the nose and was treated with 5625 cGy in 25 fractions in 1996 under the care of Dr. Valere Dross.   More recently, he was discovered to have a lesion at the right aspect of the superior nasal tip, and punch biopsy on 05/24/2017 was consistent with basal cell carcinoma, nodular pattern.  He is seen today to consider radiotherapy to this site.  He is hoping to avoid Mohs procedure.  PREVIOUS RADIATION THERAPY: Yes    03/05/1995 to 04/04/1995: Nasal tip treated to 56.25 Gy in 25 fractions  PAST MEDICAL HISTORY:  Past Medical History:  Diagnosis Date  . ADHD (attention deficit hyperactivity disorder)    no treatment  . Allergy    seasonal  . Hematuria   . Hyperlipidemia   . Skin cancer    nose- radiation only. Cared for regularly Dr. Wilhemina Bonito.  . Tremor    benign, essential      PAST SURGICAL HISTORY: Past Surgical History:  Procedure Laterality Date  . [place]   right  . left heel surgery     Dr. Noemi Chapel.around 2000    FAMILY HISTORY:  Family History  Problem Relation Age of Onset  . Diabetes Mother   . Congestive Heart Failure Mother        passed at 40  . Aortic aneurysm  Mother   . Hypertension Mother   . Stroke Mother        23  . Breast cancer Father 19  . Diabetes Brother   . Thyroid cancer Brother   . Other Brother        adopted  . Colon cancer Cousin     SOCIAL HISTORY:  Social History   Socioeconomic History  . Marital status: Married    Spouse name: Not on file  . Number of children: 3  . Years of education: Not on file  . Highest education level: Not on file  Social Needs  . Financial resource strain: Not on file  . Food insecurity - worry: Not on file  . Food insecurity - inability: Not on file  . Transportation needs - medical: Not on file  . Transportation needs - non-medical: Not on file  Occupational History  . Not on file  Tobacco Use  . Smoking status: Current Some Day Smoker    Years: 2.00    Types: Cigars  . Smokeless tobacco: Never Used  Substance and Sexual Activity  . Alcohol use: Yes    Alcohol/week: 1.2 oz    Types: 2 Cans of beer per week    Comment: social only  . Drug use: No  .  Sexual activity: Yes  Other Topics Concern  . Not on file  Social History Narrative   Married 1984. 3 children- 29/28/27- in 2017. 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 Enjoys his work- 36 years in 2017.       Hobbies: golf, enjoys cigars       ALLERGIES: Patient has no known allergies.  MEDICATIONS:  Current Outpatient Medications  Medication Sig Dispense Refill  . aspirin EC 81 MG tablet Take 1 tablet (81 mg total) by mouth daily.    . cetirizine (ZYRTEC) 10 MG chewable tablet Chew 10 mg by mouth daily as needed for allergies.     . diazepam (VALIUM) 5 MG tablet Take 1 tablet (5 mg total) by mouth as needed. For travel 30 tablet 3  . fish oil-omega-3 fatty acids 1000 MG capsule Take 2 g by mouth daily.      Marland Kitchen ibuprofen (ADVIL,MOTRIN) 200 MG tablet Take 600-800 mg by mouth every 6 (six) hours as needed for moderate pain.    . montelukast (SINGULAIR) 10 MG  tablet Take 1 tablet (10 mg total) by mouth at bedtime. 30 tablet 1  . Multiple Vitamin (MULTIVITAMIN) tablet Take 1 tablet by mouth daily.      . simvastatin (ZOCOR) 20 MG tablet Take 1 tablet (20 mg total) by mouth at bedtime. 90 tablet 3  . tadalafil (CIALIS) 20 MG tablet TAKE 1 TABLET ONCE DAILY AS NEEDED FOR ERECTILE DYSFUNCTION. 30 tablet 5   No current facility-administered medications for this encounter.     REVIEW OF SYSTEMS:  On review of systems, the patient reports that he is doing well overall. He denies any chest pain, shortness of breath, cough, fevers, chills, night sweats, unintended weight changes. He denies any bowel or bladder disturbances, and denies abdominal pain, nausea or vomiting. He denies any new musculoskeletal or joint aches or pains. A complete review of systems is obtained and is otherwise negative.    PHYSICAL EXAM:  Wt Readings from Last 3 Encounters:  06/04/17 216 lb 3.2 oz (98.1 kg)  04/12/17 216 lb 3.2 oz (98.1 kg)  09/21/16 208 lb 12.8 oz (94.7 kg)   Temp Readings from Last 3 Encounters:  06/04/17 98 F (36.7 C) (Oral)  04/12/17 97.9 F (36.6 C) (Oral)  09/21/16 98.5 F (36.9 C) (Oral)   BP Readings from Last 3 Encounters:  06/04/17 115/83  04/12/17 108/70  09/21/16 118/68   Pulse Readings from Last 3 Encounters:  06/04/17 61  04/12/17 64  09/21/16 66   Pain Assessment Pain Score: 0-No pain/10  In general this is a well appearing Caucasian man in no acute distress. He is alert and oriented x4 and appropriate throughout the examination. HEENT reveals that the patient is normocephalic, atraumatic. EOMs are intact. PERRLA. Skin is intact and dry crusting is noted of the biopsy site on the right lateral aspect of the nasal tip.  Cardiopulmonary assessment is negative for acute distress and he exhibits normal effort. Lymphatic exam reveals no palpable adenopathy of the submandibular, preauricular, or posterior auricular nodes. No adenopathy is  noted of the cervical or supraclavicular nodes.      KPS = 100  100 - Normal; no complaints; no evidence of disease. 90   - Able to carry on normal activity; minor signs or symptoms of disease. 80   - Normal activity with effort; some signs or symptoms of disease. 70   -  Cares for self; unable to carry on normal activity or to do active work. 60   - Requires occasional assistance, but is able to care for most of his personal needs. 50   - Requires considerable assistance and frequent medical care. 53   - Disabled; requires special care and assistance. 25   - Severely disabled; hospital admission is indicated although death not imminent. 48   - Very sick; hospital admission necessary; active supportive treatment necessary. 10   - Moribund; fatal processes progressing rapidly. 0     - Dead  Karnofsky DA, Abelmann [place], Craver LS and Burchenal [facility] [phone]) The use of the nitrogen mustards in the palliative treatment of carcinoma: with particular reference to bronchogenic carcinoma Cancer 1 634-56  LABORATORY DATA:  Lab Results  Component Value Date   WBC 5.6 04/06/2017   HGB 15.8 04/06/2017   HCT 46.7 04/06/2017   MCV 97.5 04/06/2017   PLT 264.0 04/06/2017   Lab Results  Component Value Date   NA 142 04/06/2017   K 4.8 04/06/2017   CL 106 04/06/2017   CO2 31 04/06/2017   Lab Results  Component Value Date   ALT 21 04/06/2017   AST 22 04/06/2017   ALKPHOS 57 04/06/2017   BILITOT 0.6 04/06/2017     RADIOGRAPHY: No results found.    IMPRESSION/PLAN: 1. 62 y.o. gentleman with basal cell carcinoma of the nasal tip. We discussed the pathology findings and reviewed the nature of basal cell carcinoma.  We discussed the various options available for the treatment in this setting.  We compared and contrasted Mohs surgery versus re-irradiation.  I reviewed with him the existing literature regarding reirradiation in terms of efficacy, safety, and cosmesis.  Based on an informed  discussion, the patient elects to proceed with reirradiation.  Evidence suggests a high likelihood for local tumor control with minimal risk for morbidity if a protracted fractionation scheme is employed.  We discussed the risks, benefits, short, and long term effects of radiotherapy, and the patient is interested in proceeding. We discussed the delivery and logistics of radiotherapy and anticipates a course of 30 fractions over 6 weeks. We will plan for electron treatment with CT simulation for [facility]-based planning and to crudely assess for presence of any abnormal lymph nodes in the parotid, level 1, or level 2 and 3 regions. Our staff will contact him to coordinate this. We will coordinate simulation later in the next week or two.       Carola Rhine, [facility]   Page Me    Seen with _____________________________________  Sheral Apley Tammi Klippel, M.D.  This document serves as a record of services personally performed by Tyler Pita, MD and Shona Simpson, PA-C. It was created on their behalf by Rae Lips, a trained medical scribe. The creation of this record is based on the scribe's personal observations and the providers' statements to them. This document has been checked and approved by the attending providers.    References: LinkOut  1. Cancer. 1995 May 1;75(9):2351-5.  Reirradiation of recurrent skin cancer of the face. A successful salvage modality.  Chao CK(1), Metta Clines, Sharen Counter CA.  Chief Strategy Officer information:  (1)Radiation [place], [employer] of Radiology, [facility] of Medicine, [place], [place], [place].  BACKGROUND: The aim of this study was to assess the effectiveness of full doses of reirradiation to salvage previously irradiated recurrent skin cancer of the face. METHODS: As of December 1990, 17 patients at the [facility] of Radiology  were reirradiated for recurrent skin cancer in previously irradiated areas of  the face. There were eight basal cell and nine squamous cell carcinomas. The interval between the two treatments ranged from 6 months to 15 years (median, 4 years). The dosimetry was reviewed, and the radiation doses were recomputed by  the biologic effective dose (BED) formula. RESULTS: The accumulated BED from the two irradiation regimens ranged from 48.78  to 143.5 Gy on the skin surface (median, 103 Gy) and 80.93 to 160.68 Gy at 5 mm depth (median, 108 Gy). Local control was achieved in ten patients; two of them had skin defects that required reconstruction. Six of seven patients with persistent tumors had squamous cell carcinoma. Those patients with BED of previous treatment at 5 mm depth less than 55 Gy, and accumulated BED on skin surface of no more than 110 Gy had the best outcome. The residual damage of skin  from previous treatment is a function of fraction size and total dose. CONCLUSIONS: Reirradiation is a feasible treatment alternative for recurrent skin cancer of the face when cosmesis is a concern.   PMID: [id number]  [Indexed for MEDLINE]

## 2017-06-01 NOTE — Progress Notes (Signed)
Histology and Location of Primary Skin Cancer: recurrent basal cell carcinoma, tip of nose  Dakota Barker was treated with dry ice cryotherapy to the tip of his nose for a hemangioma as an infant. Then, he received a cumulative dose of 5625 cGy in 25 sessions from 03/05/1995 through 04/04/1995 under the care of Dr. Valere Dross. Patient returned for follow up with Dr. Valere Dross 07/09/1995 and erythema was reported. Patient reported the erythema presented after being in a steam room and falling asleep on the beach while [place] in [place].  Past/Anticipated interventions by patient's surgeon/dermatologist for current problematic lesion, if any: tip of nose  Past skin cancers, if any:  1) Location/Histology/Intervention: tip of nose/basal cell/electron therapy  2) Location/Histology/Intervention: n/a  3) Location/Histology/Intervention: n/a  History of Blistering sunburns, if any: Fell asleep [place] the beach of [place] while [place] and got a sunburn  SAFETY ISSUES:  Prior radiation? yes  Pacemaker/ICD? no  Possible current pregnancy? no  Is the patient on methotrexate? no  Current Complaints / other details:  62 year old male. Married with three children. Enjoys golf and other outdoor sports. Works as a Music therapist.   Dr. Baruch Gouty referred patient to Dr. Tammi Klippel for consideration of retreatment to avoid loss of most of the patient's nose with Mohs surgery.

## 2017-06-04 ENCOUNTER — Ambulatory Visit
Admission: RE | Admit: 2017-06-04 | Discharge: 2017-06-04 | Disposition: A | Discharge status: Home / Self Care | Payer: 59 | Source: Ambulatory Visit | Attending: Radiation Oncology | Admitting: Radiation Oncology

## 2017-06-04 ENCOUNTER — Other Ambulatory Visit: Payer: Self-pay

## 2017-06-04 ENCOUNTER — Encounter: Payer: Self-pay | Admitting: Radiation Oncology

## 2017-06-04 VITALS — BP 115/83 | HR 61 | Temp 98.0°F | Resp 18 | Ht 70.5 in | Wt 216.2 lb

## 2017-06-04 DIAGNOSIS — Z923 Personal history of irradiation: Secondary | ICD-10-CM | POA: Diagnosis not present

## 2017-06-04 DIAGNOSIS — C44311 Basal cell carcinoma of skin of nose: Secondary | ICD-10-CM | POA: Diagnosis not present

## 2017-06-04 NOTE — Progress Notes (Signed)
See progress note under physician encounter. 

## 2017-06-08 DIAGNOSIS — C44311 Basal cell carcinoma of skin of nose: Secondary | ICD-10-CM | POA: Insufficient documentation

## 2017-06-08 DIAGNOSIS — Z510 Encounter for antineoplastic radiation therapy: Secondary | ICD-10-CM | POA: Insufficient documentation

## 2017-06-13 ENCOUNTER — Encounter (INDEPENDENT_AMBULATORY_CARE_PROVIDER_SITE_OTHER): Payer: Self-pay

## 2017-06-13 ENCOUNTER — Ambulatory Visit
Admission: RE | Admit: 2017-06-13 | Discharge: 2017-06-13 | Disposition: A | Discharge status: Home / Self Care | Payer: 59 | Source: Ambulatory Visit | Attending: Radiation Oncology | Admitting: Radiation Oncology

## 2017-06-13 DIAGNOSIS — C44311 Basal cell carcinoma of skin of nose: Secondary | ICD-10-CM

## 2017-06-13 DIAGNOSIS — Z510 Encounter for antineoplastic radiation therapy: Secondary | ICD-10-CM | POA: Diagnosis not present

## 2017-06-13 NOTE — Progress Notes (Signed)
  Radiation Oncology         [phone] ________________________________  Name: Dakota Barker MRN: [id number]  Date: 06/13/2017  DOB: 1955/05/25  SIMULATION AND TREATMENT PLANNING NOTE    ICD-10-CM   1. 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  NARRATIVE:  The patient was brought to the [facility].  Identity was confirmed.  All relevant records and images related to the planned course of therapy were reviewed.  The patient freely provided informed written consent to proceed with treatment after reviewing the details related to the planned course of therapy. The consent form was witnessed and verified by the simulation staff.  Then, the patient was set-up in a stable reproducible  supine position for radiation therapy.  CT images were obtained.  Surface markings were placed.  The CT images were loaded into the planning software.  Then the target and avoidance structures were contoured.  Treatment planning then occurred.  The radiation prescription was entered and confirmed.  Then, I designed and supervised the construction of a total of 4 medically necessary complex treatment devices accuform neck cushion, open thermoplastic mask, skin level collimation with lead and cerrobend lead block.  I have requested : 3D Simulation  I have requested a DVH of the following structures: Skin, left eye, right eye, oral cavity and lips.  PLAN:  The patient will receive 60 Gy in 30 fractions.  ________________________________  Sheral Apley Tammi Klippel, M.D.  This document serves as a record of services personally performed by Tyler Pita, MD. It was created on his behalf by Arlyce Harman, a trained medical scribe. The creation of this record is based on the scribe's personal observations and the provider's statements to them. This document has been checked and  approved by the attending provider.

## 2017-06-19 ENCOUNTER — Encounter (INDEPENDENT_AMBULATORY_CARE_PROVIDER_SITE_OTHER): Payer: Self-pay

## 2017-06-20 DIAGNOSIS — C44311 Basal cell carcinoma of skin of nose: Secondary | ICD-10-CM | POA: Diagnosis not present

## 2017-06-20 DIAGNOSIS — Z510 Encounter for antineoplastic radiation therapy: Secondary | ICD-10-CM | POA: Diagnosis not present

## 2017-06-21 ENCOUNTER — Ambulatory Visit
Admission: RE | Admit: 2017-06-21 | Discharge: 2017-06-21 | Disposition: A | Discharge status: Home / Self Care | Payer: 59 | Source: Ambulatory Visit | Attending: Radiation Oncology | Admitting: Radiation Oncology

## 2017-06-21 DIAGNOSIS — Z510 Encounter for antineoplastic radiation therapy: Secondary | ICD-10-CM | POA: Diagnosis not present

## 2017-06-21 DIAGNOSIS — C44311 Basal cell carcinoma of skin of nose: Secondary | ICD-10-CM | POA: Diagnosis not present

## 2017-06-22 ENCOUNTER — Ambulatory Visit
Admission: RE | Admit: 2017-06-22 | Discharge: 2017-06-22 | Disposition: A | Discharge status: Home / Self Care | Payer: 59 | Source: Ambulatory Visit | Attending: Radiation Oncology | Admitting: Radiation Oncology

## 2017-06-22 DIAGNOSIS — C44311 Basal cell carcinoma of skin of nose: Secondary | ICD-10-CM

## 2017-06-22 DIAGNOSIS — Z510 Encounter for antineoplastic radiation therapy: Secondary | ICD-10-CM | POA: Diagnosis not present

## 2017-06-22 MED ORDER — SONAFINE EX EMUL
1.0000 "application " | Freq: Two times a day (BID) | CUTANEOUS | Status: DC
  Administered 2017-06-22: 1 via TOPICAL

## 2017-06-22 NOTE — Progress Notes (Signed)
Pt here for patient teaching.  Pt given Radiation and You booklet, skin care instructions and Sonafine.  Reviewed areas of pertinence such as fatigue and skin changes . Pt able to give teach back of to pat skin and use unscented/gentle soap,apply Sonafine bid and avoid applying anything to skin within 4 hours of treatment. Pt demonstrated understanding, needs reinforcement, no evidence of learning, refused teaching and  of information given and will contact nursing with any questions or concerns.     Http://rtanswers.org/treatmentinformation/whattoexpect/index

## 2017-06-25 ENCOUNTER — Ambulatory Visit
Admission: RE | Admit: 2017-06-25 | Discharge: 2017-06-25 | Disposition: A | Discharge status: Home / Self Care | Payer: 59 | Source: Ambulatory Visit | Attending: Radiation Oncology | Admitting: Radiation Oncology

## 2017-06-25 DIAGNOSIS — Z510 Encounter for antineoplastic radiation therapy: Secondary | ICD-10-CM | POA: Diagnosis not present

## 2017-06-26 ENCOUNTER — Ambulatory Visit
Admission: RE | Admit: 2017-06-26 | Discharge: 2017-06-26 | Disposition: A | Discharge status: Home / Self Care | Payer: 59 | Source: Ambulatory Visit | Attending: Radiation Oncology | Admitting: Radiation Oncology

## 2017-06-26 DIAGNOSIS — Z510 Encounter for antineoplastic radiation therapy: Secondary | ICD-10-CM | POA: Diagnosis not present

## 2017-06-27 ENCOUNTER — Ambulatory Visit
Admission: RE | Admit: 2017-06-27 | Discharge: 2017-06-27 | Disposition: A | Discharge status: Home / Self Care | Payer: 59 | Source: Ambulatory Visit | Attending: Radiation Oncology | Admitting: Radiation Oncology

## 2017-06-27 DIAGNOSIS — Z510 Encounter for antineoplastic radiation therapy: Secondary | ICD-10-CM | POA: Diagnosis not present

## 2017-06-27 DIAGNOSIS — C44311 Basal cell carcinoma of skin of nose: Secondary | ICD-10-CM | POA: Diagnosis not present

## 2017-06-28 ENCOUNTER — Ambulatory Visit
Admission: RE | Admit: 2017-06-28 | Discharge: 2017-06-28 | Disposition: A | Discharge status: Home / Self Care | Payer: 59 | Source: Ambulatory Visit | Attending: Radiation Oncology | Admitting: Radiation Oncology

## 2017-06-28 DIAGNOSIS — Z510 Encounter for antineoplastic radiation therapy: Secondary | ICD-10-CM | POA: Diagnosis not present

## 2017-06-29 ENCOUNTER — Ambulatory Visit
Admission: RE | Admit: 2017-06-29 | Discharge: 2017-06-29 | Disposition: A | Discharge status: Home / Self Care | Payer: 59 | Source: Ambulatory Visit | Attending: Radiation Oncology | Admitting: Radiation Oncology

## 2017-06-29 DIAGNOSIS — Z510 Encounter for antineoplastic radiation therapy: Secondary | ICD-10-CM | POA: Diagnosis not present

## 2017-06-29 DIAGNOSIS — C44311 Basal cell carcinoma of skin of nose: Secondary | ICD-10-CM

## 2017-06-29 MED ORDER — BIAFINE EX EMUL
Freq: Two times a day (BID) | CUTANEOUS | Status: DC
  Administered 2017-06-29: 16:00:00 via TOPICAL

## 2017-06-30 ENCOUNTER — Other Ambulatory Visit: Payer: Self-pay | Admitting: Family Medicine

## 2017-06-30 DIAGNOSIS — F4323 Adjustment disorder with mixed anxiety and depressed mood: Secondary | ICD-10-CM

## 2017-07-02 ENCOUNTER — Ambulatory Visit
Admission: RE | Admit: 2017-07-02 | Discharge: 2017-07-02 | Disposition: A | Discharge status: Home / Self Care | Payer: 59 | Source: Ambulatory Visit | Attending: Radiation Oncology | Admitting: Radiation Oncology

## 2017-07-02 DIAGNOSIS — Z510 Encounter for antineoplastic radiation therapy: Secondary | ICD-10-CM | POA: Diagnosis not present

## 2017-07-03 ENCOUNTER — Ambulatory Visit
Admission: RE | Admit: 2017-07-03 | Discharge: 2017-07-03 | Disposition: A | Discharge status: Home / Self Care | Payer: 59 | Source: Ambulatory Visit | Attending: Radiation Oncology | Admitting: Radiation Oncology

## 2017-07-03 DIAGNOSIS — Z510 Encounter for antineoplastic radiation therapy: Secondary | ICD-10-CM | POA: Diagnosis not present

## 2017-07-04 ENCOUNTER — Ambulatory Visit
Admission: RE | Admit: 2017-07-04 | Discharge: 2017-07-04 | Disposition: A | Discharge status: Home / Self Care | Payer: 59 | Source: Ambulatory Visit | Attending: Radiation Oncology | Admitting: Radiation Oncology

## 2017-07-04 DIAGNOSIS — Z510 Encounter for antineoplastic radiation therapy: Secondary | ICD-10-CM | POA: Diagnosis not present

## 2017-07-04 DIAGNOSIS — C44311 Basal cell carcinoma of skin of nose: Secondary | ICD-10-CM | POA: Diagnosis not present

## 2017-07-05 ENCOUNTER — Ambulatory Visit
Admission: RE | Admit: 2017-07-05 | Discharge: 2017-07-05 | Disposition: A | Discharge status: Home / Self Care | Payer: 59 | Source: Ambulatory Visit | Attending: Radiation Oncology | Admitting: Radiation Oncology

## 2017-07-05 DIAGNOSIS — Z510 Encounter for antineoplastic radiation therapy: Secondary | ICD-10-CM | POA: Diagnosis not present

## 2017-07-06 ENCOUNTER — Ambulatory Visit
Admission: RE | Admit: 2017-07-06 | Discharge: 2017-07-06 | Disposition: A | Discharge status: Home / Self Care | Payer: 59 | Source: Ambulatory Visit | Attending: Radiation Oncology | Admitting: Radiation Oncology

## 2017-07-06 DIAGNOSIS — C44311 Basal cell carcinoma of skin of nose: Secondary | ICD-10-CM | POA: Diagnosis not present

## 2017-07-06 DIAGNOSIS — Z510 Encounter for antineoplastic radiation therapy: Secondary | ICD-10-CM | POA: Insufficient documentation

## 2017-07-09 ENCOUNTER — Ambulatory Visit
Admission: RE | Admit: 2017-07-09 | Discharge: 2017-07-09 | Disposition: A | Discharge status: Home / Self Care | Payer: 59 | Source: Ambulatory Visit | Attending: Radiation Oncology | Admitting: Radiation Oncology

## 2017-07-09 DIAGNOSIS — C44311 Basal cell carcinoma of skin of nose: Secondary | ICD-10-CM | POA: Diagnosis not present

## 2017-07-10 ENCOUNTER — Ambulatory Visit
Admission: RE | Admit: 2017-07-10 | Discharge: 2017-07-10 | Disposition: A | Discharge status: Home / Self Care | Payer: 59 | Source: Ambulatory Visit | Attending: Radiation Oncology | Admitting: Radiation Oncology

## 2017-07-10 DIAGNOSIS — C44311 Basal cell carcinoma of skin of nose: Secondary | ICD-10-CM | POA: Diagnosis not present

## 2017-07-11 ENCOUNTER — Ambulatory Visit
Admission: RE | Admit: 2017-07-11 | Discharge: 2017-07-11 | Disposition: A | Discharge status: Home / Self Care | Payer: 59 | Source: Ambulatory Visit | Attending: Radiation Oncology | Admitting: Radiation Oncology

## 2017-07-11 DIAGNOSIS — C44311 Basal cell carcinoma of skin of nose: Secondary | ICD-10-CM | POA: Diagnosis not present

## 2017-07-12 ENCOUNTER — Ambulatory Visit
Admission: RE | Admit: 2017-07-12 | Discharge: 2017-07-12 | Disposition: A | Discharge status: Home / Self Care | Payer: 59 | Source: Ambulatory Visit | Attending: Radiation Oncology | Admitting: Radiation Oncology

## 2017-07-12 DIAGNOSIS — C44311 Basal cell carcinoma of skin of nose: Secondary | ICD-10-CM | POA: Diagnosis not present

## 2017-07-13 ENCOUNTER — Ambulatory Visit
Admission: RE | Admit: 2017-07-13 | Discharge: 2017-07-13 | Disposition: A | Discharge status: Home / Self Care | Payer: 59 | Source: Ambulatory Visit | Attending: Radiation Oncology | Admitting: Radiation Oncology

## 2017-07-13 DIAGNOSIS — C44311 Basal cell carcinoma of skin of nose: Secondary | ICD-10-CM | POA: Diagnosis not present

## 2017-07-16 ENCOUNTER — Ambulatory Visit
Admission: RE | Admit: 2017-07-16 | Discharge: 2017-07-16 | Disposition: A | Discharge status: Home / Self Care | Payer: 59 | Source: Ambulatory Visit | Attending: Radiation Oncology | Admitting: Radiation Oncology

## 2017-07-16 DIAGNOSIS — C44311 Basal cell carcinoma of skin of nose: Secondary | ICD-10-CM | POA: Diagnosis not present

## 2017-07-17 ENCOUNTER — Ambulatory Visit
Admission: RE | Admit: 2017-07-17 | Discharge: 2017-07-17 | Disposition: A | Discharge status: Home / Self Care | Payer: 59 | Source: Ambulatory Visit | Attending: Radiation Oncology | Admitting: Radiation Oncology

## 2017-07-17 DIAGNOSIS — C44311 Basal cell carcinoma of skin of nose: Secondary | ICD-10-CM | POA: Diagnosis not present

## 2017-07-18 ENCOUNTER — Ambulatory Visit
Admission: RE | Admit: 2017-07-18 | Discharge: 2017-07-18 | Disposition: A | Discharge status: Home / Self Care | Payer: 59 | Source: Ambulatory Visit | Attending: Radiation Oncology | Admitting: Radiation Oncology

## 2017-07-18 DIAGNOSIS — C44311 Basal cell carcinoma of skin of nose: Secondary | ICD-10-CM | POA: Diagnosis not present

## 2017-07-19 ENCOUNTER — Ambulatory Visit
Admission: RE | Admit: 2017-07-19 | Discharge: 2017-07-19 | Disposition: A | Discharge status: Home / Self Care | Payer: 59 | Source: Ambulatory Visit | Attending: Radiation Oncology | Admitting: Radiation Oncology

## 2017-07-19 DIAGNOSIS — C44311 Basal cell carcinoma of skin of nose: Secondary | ICD-10-CM | POA: Diagnosis not present

## 2017-07-20 ENCOUNTER — Ambulatory Visit
Admission: RE | Admit: 2017-07-20 | Discharge: 2017-07-20 | Disposition: A | Discharge status: Home / Self Care | Payer: 59 | Source: Ambulatory Visit | Attending: Radiation Oncology | Admitting: Radiation Oncology

## 2017-07-20 DIAGNOSIS — C44311 Basal cell carcinoma of skin of nose: Secondary | ICD-10-CM | POA: Diagnosis not present

## 2017-07-23 ENCOUNTER — Ambulatory Visit
Admission: RE | Admit: 2017-07-23 | Discharge: 2017-07-23 | Disposition: A | Discharge status: Home / Self Care | Payer: 59 | Source: Ambulatory Visit | Attending: Radiation Oncology | Admitting: Radiation Oncology

## 2017-07-23 DIAGNOSIS — C44311 Basal cell carcinoma of skin of nose: Secondary | ICD-10-CM | POA: Diagnosis not present

## 2017-07-24 ENCOUNTER — Ambulatory Visit
Admission: RE | Admit: 2017-07-24 | Discharge: 2017-07-24 | Disposition: A | Discharge status: Home / Self Care | Payer: 59 | Source: Ambulatory Visit | Attending: Radiation Oncology | Admitting: Radiation Oncology

## 2017-07-24 DIAGNOSIS — C44311 Basal cell carcinoma of skin of nose: Secondary | ICD-10-CM | POA: Diagnosis not present

## 2017-07-25 ENCOUNTER — Ambulatory Visit
Admission: RE | Admit: 2017-07-25 | Discharge: 2017-07-25 | Disposition: A | Discharge status: Home / Self Care | Payer: 59 | Source: Ambulatory Visit | Attending: Radiation Oncology | Admitting: Radiation Oncology

## 2017-07-25 DIAGNOSIS — C44311 Basal cell carcinoma of skin of nose: Secondary | ICD-10-CM | POA: Diagnosis not present

## 2017-07-26 ENCOUNTER — Ambulatory Visit
Admission: RE | Admit: 2017-07-26 | Discharge: 2017-07-26 | Disposition: A | Discharge status: Home / Self Care | Payer: 59 | Source: Ambulatory Visit | Attending: Radiation Oncology | Admitting: Radiation Oncology

## 2017-07-26 DIAGNOSIS — C44311 Basal cell carcinoma of skin of nose: Secondary | ICD-10-CM | POA: Diagnosis not present

## 2017-07-27 ENCOUNTER — Ambulatory Visit
Admission: RE | Admit: 2017-07-27 | Discharge: 2017-07-27 | Disposition: A | Discharge status: Home / Self Care | Payer: 59 | Source: Ambulatory Visit | Attending: Radiation Oncology | Admitting: Radiation Oncology

## 2017-07-27 DIAGNOSIS — C44311 Basal cell carcinoma of skin of nose: Secondary | ICD-10-CM | POA: Diagnosis not present

## 2017-07-30 ENCOUNTER — Ambulatory Visit
Admission: RE | Admit: 2017-07-30 | Discharge: 2017-07-30 | Disposition: A | Discharge status: Home / Self Care | Payer: 59 | Source: Ambulatory Visit | Attending: Radiation Oncology | Admitting: Radiation Oncology

## 2017-07-30 DIAGNOSIS — C44311 Basal cell carcinoma of skin of nose: Secondary | ICD-10-CM | POA: Diagnosis not present

## 2017-07-31 ENCOUNTER — Ambulatory Visit
Admission: RE | Admit: 2017-07-31 | Discharge: 2017-07-31 | Disposition: A | Discharge status: Home / Self Care | Payer: 59 | Source: Ambulatory Visit | Attending: Radiation Oncology | Admitting: Radiation Oncology

## 2017-07-31 DIAGNOSIS — C44311 Basal cell carcinoma of skin of nose: Secondary | ICD-10-CM | POA: Diagnosis not present

## 2017-08-01 ENCOUNTER — Ambulatory Visit
Admission: RE | Admit: 2017-08-01 | Discharge: 2017-08-01 | Disposition: A | Discharge status: Home / Self Care | Payer: 59 | Source: Ambulatory Visit | Attending: Radiation Oncology | Admitting: Radiation Oncology

## 2017-08-01 ENCOUNTER — Encounter: Payer: Self-pay | Admitting: Radiation Oncology

## 2017-08-01 DIAGNOSIS — C44311 Basal cell carcinoma of skin of nose: Secondary | ICD-10-CM | POA: Diagnosis not present

## 2017-08-01 NOTE — Progress Notes (Signed)
  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     Indication for treatment:  Curative       Radiation treatment dates:   06/21/2017 - 08/01/2017  Site/dose:   Nose / 2Gy x 30 fractions  Beams/energy:   3D/ 9E  Narrative: The patient tolerated radiation treatment relatively well.   At the beginning he did not have any side effects. However, by the end he endorsed mild fatigue. He also experienced erythema and peeling to his skin.  Throughout treamtent the patient denied any pain.   Plan: The patient has completed radiation treatment. The patient will return to radiation oncology clinic for routine followup in one month. I advised him to call or return sooner if he has any questions or concerns related to his recovery or treatment. ________________________________  Sheral Apley. Tammi Klippel, M.D.   This document serves as a record of services personally performed by Tyler Pita, MD. It was created on his behalf by Margit Banda, a trained medical scribe. The creation of this record is based on the scribe's personal observations and the provider's statements to them. This document has been checked and approved by the attending provider.

## 2017-08-01 NOTE — Progress Notes (Signed)
Weight and vitals stable. Hyperpigmentation with dry desquamation of treated area/nose. Reports using Sonafine as directed. Understands to continue use of Sonafine bid for at least two more weeks. One month follow up appointment card given. Patient understands to contact this RN with future needs. Patient left ambulatory and in no distress.

## 2017-09-04 ENCOUNTER — Ambulatory Visit: Payer: Self-pay | Admitting: Urology

## 2017-09-05 ENCOUNTER — Encounter: Payer: Self-pay | Admitting: Urology

## 2017-09-05 ENCOUNTER — Other Ambulatory Visit: Payer: Self-pay

## 2017-09-05 ENCOUNTER — Ambulatory Visit
Admission: RE | Admit: 2017-09-05 | Discharge: 2017-09-05 | Disposition: A | Discharge status: Home / Self Care | Payer: 59 | Source: Ambulatory Visit | Attending: Urology | Admitting: Urology

## 2017-09-05 VITALS — BP 110/74 | HR 76 | Temp 98.1°F | Resp 18 | Ht 70.5 in | Wt 213.0 lb

## 2017-09-05 DIAGNOSIS — Z7982 Long term (current) use of aspirin: Secondary | ICD-10-CM | POA: Diagnosis not present

## 2017-09-05 DIAGNOSIS — C44311 Basal cell carcinoma of skin of nose: Secondary | ICD-10-CM | POA: Diagnosis present

## 2017-09-05 DIAGNOSIS — Z923 Personal history of irradiation: Secondary | ICD-10-CM | POA: Insufficient documentation

## 2017-09-05 DIAGNOSIS — Z79899 Other long term (current) drug therapy: Secondary | ICD-10-CM | POA: Insufficient documentation

## 2017-09-05 NOTE — Progress Notes (Signed)
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    Interval Since Last Radiation:  5 weeks  06/21/2017 - 08/01/2017:  Nose / 2Gy x 30 fractions  Narrative:  The patient returns today for routine follow-up.  He tolerated radiation treatment relatively well.   At the beginning, he did not have any side effects, however, by the end he endorsed mild fatigue. He also experienced erythema and peeling to his skin in the treatment field.  Throughout treamtent the patient denied any pain.   On review of systems, the patient states that he is doing very well overall.  He is currently without complaints.  He continues to use Neosporin topically inside his nares to help with residual inflammation and irritation.  He also continues to use Sonafine cream daily externally on his nose. He denies nose bleeds or excessive drainage.  He reports that his fatigue is gradually improving.  At this point, he has been able to resume playing golf regularly.  He denies recent fevers, chills or night sweats.  ALLERGIES:  has No Known Allergies.  Meds: Current Outpatient Medications  Medication Sig Dispense Refill  . aspirin EC 81 MG tablet Take 1 tablet (81 mg total) by mouth daily.    . cetirizine (ZYRTEC) 10 MG chewable tablet Chew 10 mg by mouth daily as needed for allergies.     . fish oil-omega-3 fatty acids 1000 MG capsule Take 2 g by mouth daily.      Marland Kitchen ibuprofen (ADVIL,MOTRIN) 200 MG tablet Take 600-800 mg by mouth every 6 (six) hours as needed for moderate pain.    . montelukast (SINGULAIR) 10 MG tablet Take 1 tablet (10 mg total) by mouth at bedtime. 30 tablet 1  . Multiple Vitamin (MULTIVITAMIN) tablet Take  1 tablet by mouth daily.      . simvastatin (ZOCOR) 20 MG tablet Take 1 tablet (20 mg total) by mouth at bedtime. 90 tablet 3  . tadalafil (CIALIS) 20 MG tablet TAKE 1 TABLET ONCE DAILY AS NEEDED FOR ERECTILE DYSFUNCTION. 30 tablet 5  . diazepam (VALIUM) 5 MG tablet TAKE 1 TABLET AS NEEDED FOR FLYING. (Patient not taking: Reported on 09/05/2017) 30 tablet 1   No current facility-administered medications for this encounter.     Physical Findings:  height is 5' 10.5" (1.791 m) and weight is 213 lb (96.6 kg). His oral temperature is 98.1 F (36.7 C). His blood pressure is 110/74 and his pulse is 76. His respiration is 18 and oxygen saturation is 96%.  Pain Assessment Pain Score: 0-No pain/10 In general this is a well appearing Caucasian male in no acute distress.  He's alert and oriented x4 and appropriate throughout the examination. Cardiopulmonary assessment is negative for acute distress and he exhibits normal effort.  The skin in the treatment field appears well here healed with only mild residual hyperpigmentation and no desquamation.  There is mild residual erythema and inflammation internally within the nares but no active bleeding or drainage.  Lab Findings: Lab Results  Component Value Date   WBC 5.6 04/06/2017   HGB 15.8 04/06/2017   HCT 46.7 04/06/2017   MCV 97.5 04/06/2017   PLT  264.0 04/06/2017     Radiographic Findings: No results found.  Impression/Plan: 43. 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   He appears to have recovered well from the effects of radiotherapy.  We discussed the importance of using a minimum of 30 SPF to the treatment area during any sun exposure due to hypersensitivity from his recent radiation.  He will call for a follow-up appointment with his dermatologist, Dr. Wilhemina Bonito within the next 4 to 6 weeks and will continue in routine follow-up at his direction.  We discussed that while we are  happy to continue to participate in his care if clinically indicated, at this point, we will plan to see him back on an as-needed basis.  He knows to call at any time with any questions or concerns related to his previous radiotherapy.    Nicholos Johns, PA-C    Tyler Pita, MD  [facility] Direct Dial: [phone]  Fax: [phone] [email or web address]  Skype  LinkedIn    Page Me

## 2017-09-10 ENCOUNTER — Other Ambulatory Visit: Payer: Self-pay | Admitting: Family Medicine

## 2017-09-25 ENCOUNTER — Encounter: Payer: Self-pay | Admitting: Family Medicine

## 2017-09-25 ENCOUNTER — Ambulatory Visit: Payer: 59 | Admitting: Family Medicine

## 2017-09-25 ENCOUNTER — Ambulatory Visit (INDEPENDENT_AMBULATORY_CARE_PROVIDER_SITE_OTHER): Payer: 59

## 2017-09-25 VITALS — BP 108/68 | HR 63 | Temp 98.0°F | Ht 70.5 in | Wt 215.2 lb

## 2017-09-25 DIAGNOSIS — M7989 Other specified soft tissue disorders: Secondary | ICD-10-CM | POA: Diagnosis not present

## 2017-09-25 DIAGNOSIS — Q740 Other congenital malformations of upper limb(s), including shoulder girdle: Secondary | ICD-10-CM

## 2017-09-25 NOTE — Patient Instructions (Signed)
Please stop by x-ray before you go  I think this is benign/likely has been like this for a long time but lets play it safe and get the x-ray

## 2017-09-25 NOTE — Progress Notes (Signed)
Subjective:  Dakota Barker is a 62 y.o. year old very pleasant male patient who presents for/with See problem oriented charting ROS- no chest pain or shortness of breath, no pain at area of clavicle he is concerned about. No throat swelling or trouble swallowing.    Past Medical History-  Patient Active Problem List   Diagnosis Date Noted  . Tobacco use disorder 02/23/2009    Priority: High  . Anxiety state 01/03/2016    Priority: Medium  . Palpitations 06/05/2013    Priority: Medium  . Hyperlipidemia, mild 12/10/2007    Priority: Medium  . Benign essential tremor 12/10/2007    Priority: Medium  . Reactive airway disease 08/03/2015    Priority: Low  . Erectile dysfunction 03/05/2014    Priority: Low  . Microscopic hematuria 12/10/2007    Priority: Low  . Allergic rhinitis 01/04/2007    Priority: Low  . 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 aspirin EC 81 MG tablet Take 1 tablet (81 mg total) by mouth daily.    . cetirizine (ZYRTEC) 10 MG chewable tablet Chew 10 mg by mouth daily as needed for allergies.     . diazepam (VALIUM) 5 MG tablet TAKE 1 TABLET AS NEEDED FOR FLYING. 30 tablet 1  . fish oil-omega-3 fatty acids 1000 MG capsule Take 2 g by mouth daily.      Marland Kitchen ibuprofen (ADVIL,MOTRIN) 200 MG tablet Take 600-800 mg by mouth every 6 (six) hours as needed for moderate pain.    . montelukast (SINGULAIR) 10 MG tablet Take 1 tablet (10 mg total) by mouth at bedtime. 30 tablet 1  . Multiple Vitamin (MULTIVITAMIN) tablet Take 1 tablet by mouth daily.      . simvastatin (ZOCOR) 20 MG tablet Take 1 tablet (20 mg total) by mouth at bedtime. 90 tablet 3  . tadalafil (CIALIS) 20 MG tablet TAKE 1 TABLET BY MOUTH DAILY IF NEEDED FOR ERECTILE DYSFUNCTION 6 tablet 2   No current facility-administered medications for this visit.     Objective: BP 108/68 (BP Location:  Left Arm, Patient Position: Sitting, Cuff Size: Large)   Pulse 63   Temp 98 F (36.7 C) (Oral)   Ht 5' 10.5" (1.791 m)   Wt 215 lb 3.2 oz (97.6 kg)   SpO2 96%   BMI 30.44 kg/m  Gen: NAD, resting comfortably CV: RRR Lungs: nonlabored Ext: no edema Skin: warm, dry MSK: slightly prominent left clavicle medially. Right side normal.   Dg Clavicle Left  Result Date: 09/25/2017 CLINICAL DATA:  Prominence / hard lump at medial LEFT clavicle for several days EXAM: LEFT CLAVICLE - 2+ VIEWS COMPARISON:  None FINDINGS: Osseous mineralization normal. Acromioclavicular and sternoclavicular joint alignments normal. Prominence of the cranial margin of the LEFT clavicular head, question related to mild spur formation/degenerative changes at the sternoclavicular joint. This appears asymmetrically more prominent than on RIGHT. Visualized ribs intact. No fracture, dislocation or bone destruction. IMPRESSION: Mild prominence of the cranial margin of the LEFT clavicular head versus RIGHT raising question of sternoclavicular joint degenerative changes and minimal spurring. Electronically Signed   By: Lavonia Dana M.D.   On: 09/25/2017 15:44   I personally reviewed this x-ray and noted no obvious fracture. Left clavicular head is somewhat prominent with some spurring- some degenerative changes noted of sternoclavicular joint with some spurring noted.   Assessment/Plan:  Abnormal prominence of clavicle -  Plan: DG Clavicle Left S:  noted enlargement in left clavicle a few days ago when rubbing neck. Daughter felt and also thought enlarged. Denies pain. Has never noticed this enlargement before A/P: Discussed with patient mild asymmetry likely benign- he would prefer to get an x-ray to get some further reassurance. We did this today and noted some mild arthritis in the joint which likely accounts for enlargement of clavicular head. Luckily he is not experiencing pain.   Lab/Order associations: Abnormal prominence  of clavicle - Plan: DG Clavicle Left  Return precautions advised.  Garret Reddish, MD

## 2017-09-26 ENCOUNTER — Telehealth: Payer: Self-pay | Admitting: Family Medicine

## 2017-09-26 NOTE — Telephone Encounter (Signed)
See note.   Copied from [place] [phone] [id number]. Topic: Quick Communication - Lab Results >> Sep 26, 2017 12:09 PM Everrett Coombe, CMA wrote: Called patient to inform them of 09/25/2017 lab results. When patient returns call, triage nurse may disclose results. >> Sep 26, 2017  1:53 PM Yvette Rack wrote: pt states that he had gotten results off [facility] and don't need to speak with anyone unless its abnormal

## 2017-09-27 NOTE — Telephone Encounter (Signed)
Lab results updated to reflect that patient called stating he viewed them

## 2017-12-10 ENCOUNTER — Other Ambulatory Visit: Payer: Self-pay

## 2017-12-10 ENCOUNTER — Telehealth: Payer: Self-pay | Admitting: Family Medicine

## 2017-12-10 MED ORDER — TADALAFIL 20 MG PO TABS: ORAL_TABLET | ORAL | 2 refills | Status: DC

## 2017-12-10 NOTE — Telephone Encounter (Signed)
Prescription sent to pharmacy as requested.

## 2017-12-10 NOTE — Telephone Encounter (Signed)
Copied from [place] [id number]. Topic: Quick Communication - Rx Refill/Question >> Dec 10, 2017 12:49 PM Percell Belt A wrote: Medication: tadalafil (CIALIS) 20 MG tablet [[id number]]   Has the patient contacted their pharmacy? {no  (Agent: If no, request that the patient contact the pharmacy for the refill.) (Agent: If yes, when and what did the pharmacy advise?)  Preferred Pharmacy (with phone number or street name): [id number] - [address], [address] [phone] (Phone  Pt stated that he would like a refill for more then 6 pills. He is requesting at least 15.  Pt is willing to pay out of pocket if ins does not cover FYI   Agent: Please be advised that RX refills may take up to 3 business days. We ask that you follow-up with your pharmacy.

## 2018-01-22 ENCOUNTER — Telehealth: Payer: Self-pay | Admitting: *Deleted

## 2018-01-22 DIAGNOSIS — R05 Cough: Secondary | ICD-10-CM

## 2018-01-22 DIAGNOSIS — R059 Cough, unspecified: Secondary | ICD-10-CM

## 2018-01-22 NOTE — Telephone Encounter (Signed)
Copied from [place] [phone]. Topic: General - Other >> Jan 22, 2018  9:04 AM [name], NT wrote: Reason for CRM: Patient wants to know if he can order for chest  xrays placed before his cpe date of 03/08/18 please call once the order is placed at [phone]

## 2018-01-22 NOTE — Telephone Encounter (Signed)
You can order this under cough.   Put this in comments "  cough- suspect allergy related- evaluate potential malignancy, cigar smoker   "

## 2018-01-23 ENCOUNTER — Ambulatory Visit (INDEPENDENT_AMBULATORY_CARE_PROVIDER_SITE_OTHER)
Admission: RE | Admit: 2018-01-23 | Discharge: 2018-01-23 | Disposition: A | Discharge status: Home / Self Care | Payer: 59 | Source: Ambulatory Visit | Attending: Family Medicine | Admitting: Family Medicine

## 2018-01-23 DIAGNOSIS — R059 Cough, unspecified: Secondary | ICD-10-CM

## 2018-01-23 DIAGNOSIS — R05 Cough: Secondary | ICD-10-CM

## 2018-01-23 NOTE — Telephone Encounter (Signed)
Spoke to pt, told him I have put order in for Chest x-ray and can come in  prior to physical with Dr. Yong Channel. Pt verbalized understanding and said he would like to have it at [facility]. Told pt okay I will change order and he can go there. Pt verbalized understanding.

## 2018-01-23 NOTE — Addendum Note (Signed)
Addended by: Marian Sorrow on: 01/23/2018 11:06 AM   Modules accepted: Orders

## 2018-01-24 ENCOUNTER — Encounter: Payer: Self-pay | Admitting: Family Medicine

## 2018-02-28 ENCOUNTER — Other Ambulatory Visit: Payer: Self-pay | Admitting: Family Medicine

## 2018-02-28 DIAGNOSIS — E785 Hyperlipidemia, unspecified: Secondary | ICD-10-CM

## 2018-03-08 ENCOUNTER — Ambulatory Visit (INDEPENDENT_AMBULATORY_CARE_PROVIDER_SITE_OTHER): Payer: 59 | Admitting: Family Medicine

## 2018-03-08 ENCOUNTER — Encounter: Payer: Self-pay | Admitting: Family Medicine

## 2018-03-08 VITALS — BP 100/68 | HR 61 | Temp 97.7°F | Ht 70.5 in | Wt 222.8 lb

## 2018-03-08 DIAGNOSIS — Z0000 Encounter for general adult medical examination without abnormal findings: Secondary | ICD-10-CM | POA: Diagnosis not present

## 2018-03-08 DIAGNOSIS — Z23 Encounter for immunization: Secondary | ICD-10-CM

## 2018-03-08 DIAGNOSIS — Z1159 Encounter for screening for other viral diseases: Secondary | ICD-10-CM

## 2018-03-08 DIAGNOSIS — R739 Hyperglycemia, unspecified: Secondary | ICD-10-CM

## 2018-03-08 DIAGNOSIS — F17200 Nicotine dependence, unspecified, uncomplicated: Secondary | ICD-10-CM

## 2018-03-08 DIAGNOSIS — E785 Hyperlipidemia, unspecified: Secondary | ICD-10-CM

## 2018-03-08 DIAGNOSIS — Z125 Encounter for screening for malignant neoplasm of prostate: Secondary | ICD-10-CM | POA: Diagnosis not present

## 2018-03-08 LAB — POCT URINALYSIS DIPSTICK
Bilirubin, UA: NEGATIVE
Blood, UA: NEGATIVE
Glucose, UA: NEGATIVE
LEUKOCYTES UA: NEGATIVE
NITRITE UA: NEGATIVE
PH UA: 6 (ref 5.0–8.0)
PROTEIN UA: NEGATIVE
Spec Grav, UA: >=1.03 — AB (ref 1.010–1.025)
UROBILINOGEN UA: 0.2 U/dL

## 2018-03-08 LAB — LIPID PANEL
CHOLESTEROL: 175 mg/dL (ref 0–200)
HDL: 39.5 mg/dL (ref >39.00)
NonHDL: 135.19
Total CHOL/HDL Ratio: 4
Triglycerides: 387 mg/dL — ABNORMAL HIGH (ref 0.0–149.0)
VLDL: 77.4 mg/dL — ABNORMAL HIGH (ref 0.0–40.0)

## 2018-03-08 LAB — COMPREHENSIVE METABOLIC PANEL
ALBUMIN: 4.4 g/dL (ref 3.5–5.2)
ALK PHOS: 61 U/L (ref 39–117)
ALT: 19 U/L (ref 0–53)
AST: 20 U/L (ref 0–37)
BILIRUBIN TOTAL: 0.4 mg/dL (ref 0.2–1.2)
BUN: 17 mg/dL (ref 6–23)
CALCIUM: 9.3 mg/dL (ref 8.4–10.5)
CO2: 26 mEq/L (ref 19–32)
Chloride: 106 mEq/L (ref 96–112)
Creatinine, Ser: 1.14 mg/dL (ref 0.40–1.50)
GFR: 69.3 mL/min (ref >60.00)
GLUCOSE: 84 mg/dL (ref 70–99)
POTASSIUM: 4.3 meq/L (ref 3.5–5.1)
Sodium: 141 mEq/L (ref 135–145)
Total Protein: 6.6 g/dL (ref 6.0–8.3)

## 2018-03-08 LAB — LDL CHOLESTEROL, DIRECT: Direct LDL: 120 mg/dL

## 2018-03-08 LAB — CBC
HCT: 44.2 % (ref 39.0–52.0)
HEMOGLOBIN: 15.5 g/dL (ref 13.0–17.0)
MCHC: 35 g/dL (ref 30.0–36.0)
MCV: 97.6 fl (ref 78.0–100.0)
PLATELETS: 258 10*3/uL (ref 150.0–400.0)
RBC: 4.52 Mil/uL (ref 4.22–5.81)
RDW: 12.7 % (ref 11.5–15.5)
WBC: 6.3 10*3/uL (ref 4.0–10.5)

## 2018-03-08 LAB — PSA: PSA: 1.06 ng/mL (ref 0.10–4.00)

## 2018-03-08 LAB — HEMOGLOBIN A1C: Hgb A1c MFr Bld: 5.6 % (ref 4.6–6.5)

## 2018-03-08 NOTE — Progress Notes (Signed)
Phone: [phone]  Subjective:  Patient presents today for their annual physical. Chief complaint-noted.   See problem oriented charting- ROS- full  review of systems was completed and negative per full ROS sheet given to patient including No chest pain or shortness of breath. No headache or blurry vision.   The following were reviewed and entered/updated in epic: Past Medical History:  Diagnosis Date  . ADHD (attention deficit hyperactivity disorder)    no treatment  . Allergy    seasonal  . Hematuria   . Hyperlipidemia   . Skin cancer    nose- radiation only. Cared for regularly Dr. Wilhemina Bonito.  . Tremor    benign, essential   Patient Active Problem List   Diagnosis Date Noted  . Tobacco use disorder 02/23/2009    Priority: High  . Anxiety state 01/03/2016    Priority: Medium  . Palpitations 06/05/2013    Priority: Medium  . Hyperlipidemia, mild 12/10/2007    Priority: Medium  . Benign essential tremor 12/10/2007    Priority: Medium  . Reactive airway disease 08/03/2015    Priority: Low  . Erectile dysfunction 03/05/2014    Priority: Low  . Microscopic hematuria 12/10/2007    Priority: Low  . Allergic rhinitis 01/04/2007    Priority: Low  . Basal cell carcinoma (BCC) of nasal tip 06/04/2017   Past Surgical History:  Procedure Laterality Date  . [place]   right  . left heel surgery     Dr. Noemi Chapel.around 2000    Family History  Problem Relation Age of Onset  . Diabetes Mother   . Congestive Heart Failure Mother        passed at 73  . Aortic aneurysm Mother   . Hypertension Mother   . Stroke Mother        46  . Breast cancer Father 70  . Diabetes Brother   . Thyroid cancer Brother   . Other Brother        adopted  . Colon cancer Cousin     Medications- reviewed and updated Current Outpatient Medications  Medication Sig Dispense Refill  . aspirin EC 81 MG tablet Take 1 tablet (81 mg total) by mouth daily.    . cetirizine (ZYRTEC) 10  MG chewable tablet Chew 10 mg by mouth daily as needed for allergies.     . diazepam (VALIUM) 5 MG tablet TAKE 1 TABLET AS NEEDED FOR FLYING. 30 tablet 1  . fish oil-omega-3 fatty acids 1000 MG capsule Take 2 g by mouth daily.      Marland Kitchen ibuprofen (ADVIL,MOTRIN) 200 MG tablet Take 600-800 mg by mouth every 6 (six) hours as needed for moderate pain.    . montelukast (SINGULAIR) 10 MG tablet Take 1 tablet (10 mg total) by mouth at bedtime. 30 tablet 1  . Multiple Vitamin (MULTIVITAMIN) tablet Take 1 tablet by mouth daily.      . simvastatin (ZOCOR) 20 MG tablet TAKE ONE TABLET AT BEDTIME. 90 tablet 2  . tadalafil (ADCIRCA/CIALIS) 20 MG tablet TAKE 1 TABLET BY MOUTH DAILY IF NEEDED FOR ERECTILE DYSFUNCTION 15 tablet 2   No current facility-administered medications for this visit.     Allergies-reviewed and updated No Known Allergies  Social History   Social History Narrative   Married 1984. 3 children- 29/28/27- in 2017. 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 Enjoys his work- 36 years in 2017.  Hobbies: golf, enjoys cigars       Objective: BP 100/68 (BP Location: Left Arm, Patient Position: Sitting, Cuff Size: Large)   Pulse 61   Temp 97.7 F (36.5 C) (Oral)   Ht 5' 10.5" (1.791 m)   Wt 222 lb 12.8 oz (101.1 kg)   SpO2 95%   BMI 31.52 kg/m  Gen: NAD, resting comfortably HEENT: Mucous membranes are moist. Oropharynx normal Neck: no thyromegaly CV: RRR no murmurs rubs or gallops Lungs: CTAB no crackles, wheeze, rhonchi Abdomen: soft/nontender/nondistended/normal bowel sounds. No rebound or guarding.  Ext: no edema Skin: warm, dry Neuro: grossly normal, moves all extremities, PERRLA Rectal: normal tone, diffusely enlarged prostate, no masses or tenderness  EKG: sinus bradycardia with rate 58, normal axis, normal intervals, no hypertrophy, no st or t wave chages  Assessment/Plan:  62 y.o. male presenting for  annual physical.  Health Maintenance counseling: 1. Anticipatory guidance: Patient counseled regarding regular dental exams -q6 months, eye exams -he has considered seeing DR. Gershon Crane - hasnt seen yet, wearing seatbelts.  2. Risk factor reduction:  Advised patient of need for regular exercise and diet rich and fruits and vegetables to reduce risk of heart attack and stroke. Exercise-reported this had been down last year- did ok in summer time but has dropped off in last month or two- considering doing gym on way home. Diet-last visit his goal was to get down to the 195-200 range but unfortunately he has gained 6 pounds since last physical- really loves food and tends to overeat- he feels he may do better once starts exercising.  Wt Readings from Last 3 Encounters:  03/08/18 222 lb 12.8 oz (101.1 kg)  09/25/17 215 lb 3.2 oz (97.6 kg)  09/05/17 213 lb (96.6 kg)  3. Immunizations/screenings/ancillary studies-flu shot today.  Shingrix No. 1 given.  Discussed hepatitis C screening needed-we will add with labs Immunization History  Administered Date(s) Administered  . Influenza Split 01/24/2011  . Influenza Whole 03/18/2009, 03/24/2010  . Influenza, Seasonal, Injecte, Preservative Fre 04/22/2012  . Influenza,inj,Quad PF,6+ Mos 03/04/2013, 03/23/2015, 04/20/2016, 04/12/2017  . Influenza-Unspecified 02/26/2014  . Td 05/08/2002  . Tdap 03/04/2013  4. Prostate cancer screening- PSA trend has been largely stable.  Has had some BPH on exam.  Nocturia 1-2 times a night.  Update PSA with labs Lab Results  Component Value Date   PSA 1.15 04/06/2017   PSA 1.10 04/13/2016   PSA 0.80 03/11/2015   5. Colon cancer screening -03/27/2016 with 10-year repeat planned 6. Skin cancer screening-sees Dr. Wilhemina Bonito of dermatology yearly (had skin cancer this year). Advised regular sunscreen use. Denies worrisome, changing, or new skin lesions.   Status of chronic or acute concerns   Tobacco use disorder-still  smoking cigars twice a week at least and not willing to quit-uses for stress relief.  He requests chest x-ray for lung cancer screening- done today.  We will also get a urine.  CBGs have been mildly elevated in the past.  Weight trending up.  A1c last year was not elevated at 5.6.  We will update today to ensure no further elevation  Hyperlipidemia-remains on simvastatin 20 mg and aspirin 81 mg for primary prevention.  We bumped up his simvastatin dose from 10 mg last year.  Hopeful for LDL under 100  Allergic rhinitis.  Continues with long-term cough- better lately (worse in spring).  Zyrtec and Singulair helped some.  Gets bloody nose on Flonase. Follows with Dr. Lucia Gaskins at times.   Anxiety-uses Valium  travel.  Visits his [place]child [place].  No longer using Ambien for more prolonged flights.  Palpitations-PACs on prior EKGs.  He does have some palpitations but stable overall.  He does not want to use a beta-blocker. Often improves with cough  Return in about 1 year (around 03/09/2019) for physical.  Lab/Order associations: NOT fasting Preventative health care  Hyperlipidemia, mild - Plan: CBC, Comprehensive metabolic panel, Lipid panel, EKG 12-Lead  Screening for prostate cancer - Plan: PSA  Encounter for hepatitis C screening test for low risk patient - Plan: Hepatitis C antibody  Hyperglycemia - Plan: Hemoglobin A1c, CANCELED: Hemoglobin A1c  Tobacco use disorder - Plan: Urinalysis, CANCELED: DG Chest 2 View  Return precautions advised.  Garret Reddish, MD

## 2018-03-08 NOTE — Addendum Note (Signed)
Addended by: Tomi Likens on: 03/08/2018 02:17 PM   Modules accepted: Orders

## 2018-03-08 NOTE — Addendum Note (Signed)
Addended by: Mariam Dollar, Roselyn Reef M on: 03/08/2018 02:17 PM   Modules accepted: Orders

## 2018-03-08 NOTE — Addendum Note (Signed)
Addended by: Kayren Eaves T on: 03/08/2018 04:29 PM   Modules accepted: Orders

## 2018-03-08 NOTE — Patient Instructions (Addendum)
Thanks for doing your flu shot!  Shingrix #1 today. Repeat injection in 2-5 months. Schedule this follow up nurse visit before you leave.   Would love for you to lay down the cigars  Please stop by lab before you go

## 2018-03-09 LAB — HEPATITIS C ANTIBODY
Hepatitis C Ab: NONREACTIVE
SIGNAL TO CUT-OFF: 0.01 (ref <1.00)

## 2018-03-11 ENCOUNTER — Encounter: Payer: Self-pay | Admitting: Family Medicine

## 2018-03-11 ENCOUNTER — Other Ambulatory Visit: Payer: Self-pay

## 2018-03-11 MED ORDER — ATORVASTATIN CALCIUM 20 MG PO TABS: 20.0000 mg | ORAL_TABLET | Freq: Every day | ORAL | 3 refills | Status: DC

## 2018-06-14 ENCOUNTER — Encounter: Payer: Self-pay | Admitting: Family Medicine

## 2018-06-14 ENCOUNTER — Ambulatory Visit: Payer: 59 | Admitting: Family Medicine

## 2018-06-14 VITALS — BP 118/68 | HR 65 | Temp 98.3°F | Ht 70.5 in | Wt 220.2 lb

## 2018-06-14 DIAGNOSIS — J4521 Mild intermittent asthma with (acute) exacerbation: Secondary | ICD-10-CM | POA: Diagnosis not present

## 2018-06-14 DIAGNOSIS — J0110 Acute frontal sinusitis, unspecified: Secondary | ICD-10-CM

## 2018-06-14 DIAGNOSIS — H6591 Unspecified nonsuppurative otitis media, right ear: Secondary | ICD-10-CM | POA: Diagnosis not present

## 2018-06-14 MED ORDER — PREDNISONE 20 MG PO TABS: ORAL_TABLET | ORAL | 0 refills | Status: DC

## 2018-06-14 MED ORDER — FLUTICASONE PROPIONATE 50 MCG/ACT NA SUSP: 2.0000 | Freq: Every day | NASAL | 0 refills | Status: DC

## 2018-06-14 NOTE — Progress Notes (Signed)
Phone [phone]   Subjective:  Dakota Barker is a 63 y.o. year old very pleasant male patient who presents for/with See problem oriented charting ROS- cough without shortness of breath. Does have some fatigue. Some sinus pressure   Past Medical History-  Patient Active Problem List   Diagnosis Date Noted  . Tobacco use disorder 02/23/2009    Priority: High  . Anxiety state 01/03/2016    Priority: Medium  . Palpitations 06/05/2013    Priority: Medium  . Hyperlipidemia, mild 12/10/2007    Priority: Medium  . Benign essential tremor 12/10/2007    Priority: Medium  . Reactive airway disease 08/03/2015    Priority: Low  . Erectile dysfunction 03/05/2014    Priority: Low  . Microscopic hematuria 12/10/2007    Priority: Low  . Allergic rhinitis 01/04/2007    Priority: Low  . 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 aspirin EC 81 MG tablet Take 1 tablet (81 mg total) by mouth daily.    Marland Kitchen atorvastatin (LIPITOR) 20 MG tablet Take 1 tablet (20 mg total) by mouth daily. 90 tablet 3  . cetirizine (ZYRTEC) 10 MG chewable tablet Chew 10 mg by mouth daily as needed for allergies.     . diazepam (VALIUM) 5 MG tablet TAKE 1 TABLET AS NEEDED FOR FLYING. 30 tablet 1  . fish oil-omega-3 fatty acids 1000 MG capsule Take 2 g by mouth daily.      Marland Kitchen ibuprofen (ADVIL,MOTRIN) 200 MG tablet Take 600-800 mg by mouth every 6 (six) hours as needed for moderate pain.    . montelukast (SINGULAIR) 10 MG tablet Take 1 tablet (10 mg total) by mouth at bedtime. 30 tablet 1  . Multiple Vitamin (MULTIVITAMIN) tablet Take 1 tablet by mouth daily.      . tadalafil (ADCIRCA/CIALIS) 20 MG tablet TAKE 1 TABLET BY MOUTH DAILY IF NEEDED FOR ERECTILE DYSFUNCTION 15 tablet 2      Objective:  BP 118/68 (BP Location: Left Arm, Patient Position: Sitting, Cuff Size: Large)   Pulse 65   Temp 98.3 F  (36.8 C) (Oral)   Ht 5' 10.5" (1.791 m)   Wt 220 lb 4 oz (99.9 kg)   SpO2 98%   BMI 31.16 kg/m  Gen: NAD, appears fatigued Nasal turbinates with yellow discharge. Throat erythematous . Tympanic membrane normal on left, on right ear- grayish discoloration with an air/fluid level CV: RRR no murmurs rubs or gallops Lungs: CTAB no crackles, wheeze, rhonchi Ext: no edema Skin: warm, dry    Assessment and Plan   Right otitis media with effusion  Acute frontal sinusitis, recurrence not specified  Mild intermittent reactive airway disease with acute exacerbation S: Mainly dry cough Clear sputum when does come up 2 weeks of symptoms Even had some time out of work due to this- was more severe at first Right ear feels clogged- tends to happen on yearly basis Nasal spray and prednisone has helped in past but he doesn't love prednisone Fullness in ear- Occasionally with auto insufflation will get better Has had some sinus pain particularly today Some chest congestion Tried mucinex a few weeks ago No shortness of breath No fever- except in the beginning A/P: URI vs. Bronchitis as likely original cause of symptoms- lean more towards bronchitis with 2 weeks of symptoms- now with worsening sinus pressure - likely sinusitis and given 2 weeks of symptoms possibly bacterial.  Patient has history of reactive airways- and with OME (related to inflammed sinus passages likely) as well think a course of flonase and prednisone is reasonable- if fails to improve could try doxycycline which would have some respiratory coverage though we did discuss bronchitis has high probability of being viral so this would be more for his sinus symptoms.  From avs " Patient Instructions  Chest congestion/sinus pressure/ear congestion -looks like you have some fluid in the ear - no obvious pneumonia on exam thankfully - I dont hear wheezing or rhonchi to suggest bronchitis - with your sinus pressure - if that is not  improving significantly by Monday or Tuesday please reach out to [place] and we will send in doxyycline for you through mychart.  - rest and staying well hydrated are 2 of your best friends if possible "  Lab/Order associations: Right otitis media with effusion  Acute frontal sinusitis, recurrence not specified  Mild intermittent reactive airway disease with acute exacerbation  Meds ordered this encounter  Medications  . predniSONE (DELTASONE) 20 MG tablet    Sig: Take 1 tablet by mouth daily for 5 days, then 1/2 tablet daily for 2 days    Dispense:  6 tablet    Refill:  0  . fluticasone (FLONASE) 50 MCG/ACT nasal spray    Sig: Place 2 sprays into both nostrils daily.    Dispense:  16 g    Refill:  0    Return precautions advised.  Garret Reddish, MD

## 2018-06-14 NOTE — Patient Instructions (Addendum)
Chest congestion/sinus pressure/ear congestion -looks like you have some fluid in the ear - no obvious pneumonia on exam thankfully - I dont hear wheezing or rhonchi to suggest bronchitis - with your sinus pressure - if that is not improving significantly by Monday or Tuesday please reach out to [place] and we will send in doxyycline for you through mychart.  - rest and staying well hydrated are 2 of your best friends if possible

## 2018-06-19 ENCOUNTER — Telehealth: Payer: Self-pay

## 2018-06-19 NOTE — Telephone Encounter (Signed)
Called pt to schedule 2nd Shingles vaccine. Pt will be in 06/21/2018.

## 2018-06-21 ENCOUNTER — Ambulatory Visit (INDEPENDENT_AMBULATORY_CARE_PROVIDER_SITE_OTHER): Payer: 59

## 2018-06-21 DIAGNOSIS — Z23 Encounter for immunization: Secondary | ICD-10-CM

## 2018-07-16 ENCOUNTER — Encounter: Payer: Self-pay | Admitting: Family Medicine

## 2018-10-14 ENCOUNTER — Telehealth: Payer: Self-pay | Admitting: Family Medicine

## 2018-10-14 NOTE — Telephone Encounter (Signed)
Copied from [place] [id number]. Topic: General - Other >> Oct 14, 2018 10:13 AM Carolyn Stare wrote: Pt call to req an order for a chest xray that Dr Yong Channel orders for him

## 2018-10-14 NOTE — Telephone Encounter (Signed)
Okay for chest Xray?   Please advise

## 2018-10-14 NOTE — Telephone Encounter (Signed)
Over 6 months since last visit- would he be willing to do a virtual visit. We usually order this under cough but would like to chat about other health issues and make sure cough is not worsening. We have not been allowing cough patients in clinic so he would need to be screened by me first.

## 2018-10-15 ENCOUNTER — Ambulatory Visit (INDEPENDENT_AMBULATORY_CARE_PROVIDER_SITE_OTHER): Payer: 59 | Admitting: Family Medicine

## 2018-10-15 ENCOUNTER — Encounter: Payer: Self-pay | Admitting: Family Medicine

## 2018-10-15 ENCOUNTER — Telehealth: Payer: Self-pay | Admitting: General Practice

## 2018-10-15 ENCOUNTER — Telehealth: Payer: Self-pay | Admitting: Family Medicine

## 2018-10-15 ENCOUNTER — Other Ambulatory Visit: Payer: Self-pay | Admitting: Family Medicine

## 2018-10-15 VITALS — Ht 70.5 in | Wt 220.0 lb

## 2018-10-15 DIAGNOSIS — Z20822 Contact with and (suspected) exposure to covid-19: Secondary | ICD-10-CM

## 2018-10-15 DIAGNOSIS — R05 Cough: Secondary | ICD-10-CM

## 2018-10-15 DIAGNOSIS — F4323 Adjustment disorder with mixed anxiety and depressed mood: Secondary | ICD-10-CM

## 2018-10-15 DIAGNOSIS — F411 Generalized anxiety disorder: Secondary | ICD-10-CM

## 2018-10-15 DIAGNOSIS — R059 Cough, unspecified: Secondary | ICD-10-CM

## 2018-10-15 NOTE — Progress Notes (Signed)
Phone [phone]   Subjective:  Virtual visit via Video note. Chief complaint: Chief Complaint  Patient presents with  . Chest Pain    Pt is wanting chest Xray    This visit type was conducted due to national recommendations for restrictions regarding the COVID-19 Pandemic (e.g. social distancing).  This format is felt to be most appropriate for this patient at this time balancing risks to patient and risks to population by having him in for in person visit.  No physical exam was performed (except for noted visual exam or audio findings with Telehealth visits).    Our team/I connected with [name] at  1:00 PM EDT by a video enabled telemedicine application (doxy.me or caregility through epic) and verified that I am speaking with the correct person using two identifiers.  Location patient: [place] Location provider: [facility], office Persons participating in the virtual visit:  patient  Our team/I discussed the limitations of evaluation and management by telemedicine and the availability of in person appointments. In light of current covid-19 pandemic, patient also understands that we are trying to protect them by minimizing in office contact if at all possible.  The patient expressed consent for telemedicine visit and agreed to proceed. Patient understands insurance will be billed.   ROS- No fever, chills, shortness of breath, body aches, sore throat, or loss of taste or smell  Past Medical History-  Patient Active Problem List   Diagnosis Date Noted  . Tobacco use disorder 02/23/2009    Priority: High  . Anxiety state 01/03/2016    Priority: Medium  . Palpitations 06/05/2013    Priority: Medium  . Hyperlipidemia, mild 12/10/2007    Priority: Medium  . Benign essential tremor 12/10/2007    Priority: Medium  . Reactive airway disease 08/03/2015    Priority: Low  . Erectile dysfunction 03/05/2014    Priority: Low  . Microscopic hematuria 12/10/2007    Priority:  Low  . Allergic rhinitis 01/04/2007    Priority: Low  . 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 aspirin EC 81 MG tablet Take 1 tablet (81 mg total) by mouth daily.    Marland Kitchen atorvastatin (LIPITOR) 20 MG tablet Take 1 tablet (20 mg total) by mouth daily. 90 tablet 3  . cetirizine (ZYRTEC) 10 MG chewable tablet Chew 10 mg by mouth daily as needed for allergies.     . fish oil-omega-3 fatty acids 1000 MG capsule Take 2 g by mouth daily.      . fluticasone (FLONASE) 50 MCG/ACT nasal spray Place 2 sprays into both nostrils daily. 16 g 0  . ibuprofen (ADVIL,MOTRIN) 200 MG tablet Take 600-800 mg by mouth every 6 (six) hours as needed for moderate pain.    . montelukast (SINGULAIR) 10 MG tablet Take 1 tablet (10 mg total) by mouth at bedtime. 30 tablet 1  . Multiple Vitamin (MULTIVITAMIN) tablet Take 1 tablet by mouth daily.      . tadalafil (ADCIRCA/CIALIS) 20 MG tablet TAKE 1 TABLET BY MOUTH DAILY IF NEEDED FOR ERECTILE DYSFUNCTION 15 tablet 2  . diazepam (VALIUM) 5 MG tablet TAKE 1 TABLET AS NEEDED FOR FLYING. (Patient not taking: Reported on 10/15/2018) 30 tablet 1   No current facility-administered medications for this visit.      Objective:  Ht 5' 10.5" (1.791 m)   Wt 220 lb (99.8 kg)   BMI 31.12 kg/m  self reported vitals Gen: NAD, resting comfortably Occasional cough during visit Lungs: nonlabored, normal respiratory rate  Skin: appears dry, no obvious rash     Assessment and Plan   # Worsened cough S:patient is in town until Thursday.  Typically if he has worsening cough he likes to get CXR due to smoking history (as always advise him to quit cigars)  Patient always has low level cough but within last few weeks feels like he is coughing more. He is not coughing much up- occasionally mild phlegm. Feels well otherwise. No known covid 19 contacts A/P:  This could just be  flare of smokers cough and patient also with history of reactive airways. No obvious wheeze. Told patient could nto get CXR without negative covid test. He will not have time in town for both. He does want to get covid test done as I recommended given age, smoker, male sex (covid 71 risk score per epic moderate at 4)  Therefore: - recommend patient watch closely for shortness of breath or confusion or worsening symptoms and if those occur he should contact us immediately -recommended self isolation until negative test - sending message to [facility] for testing   Anxiety state S: patient down to 2-3 valium left (uses for travel and sits in window seat). Really works well for flying anxiety. Requests refill. Still needs window seat even with medication A/P: reviewed with patient he still has 1 refill left at pharmacy per my records (looked at [facility]). IF they need new rx, happy to fill- glad he uses sparingly with over 3 months since last refill.    Lab/Order associations: Cough  Anxiety state  Return precautions advised.  Garret Reddish, MD

## 2018-10-15 NOTE — Telephone Encounter (Signed)
Patient: Dakota Barker MRN: [id number] DOB: 08-09-1955  Reason for test: worsening cough (baseline cough in smoker but now worse) Higher risk due to age, male, smoker  Insurance information   Primary Visit Coverage   Payer Plan Sponsor Code Group Number Group Name  [employer]  [id number]   Primary Visit Coverage Subscriber   ID Name [facility] Address  [id number] [place], [place] [id number]      [place], [place] [address]

## 2018-10-15 NOTE — Assessment & Plan Note (Signed)
S: patient down to 2-3 valium left (uses for travel and sits in window seat). Really works well for flying anxiety. Requests refill. Still needs window seat even with medication A/P: reviewed with patient he still has 1 refill left at pharmacy per my records (looked at [facility]). IF they need new rx, happy to fill- glad he uses sparingly with over 3 months since last refill.

## 2018-10-15 NOTE — Telephone Encounter (Addendum)
Pt has been scheduled for covid-19 testing.  °

## 2018-10-15 NOTE — Telephone Encounter (Signed)
Pt has been set up for VV for today at 1pm. No further action needed at this time.

## 2018-10-15 NOTE — Patient Instructions (Addendum)
There are no preventive care reminders to display for this patient.  Depression screen [facility] 2/9 10/15/2018 09/05/2017 06/04/2017  Decreased Interest 0 0 0  Down, Depressed, Hopeless 0 0 0  PHQ - 2 Score 0 0 0

## 2018-10-15 NOTE — Addendum Note (Signed)
Addended by: Denman George on: 10/15/2018 03:28 PM   Modules accepted: Orders

## 2018-10-15 NOTE — Telephone Encounter (Signed)
Pt has been scheduled covid-19 testing.   Scheduled with pt directly.   Pt was referred by: Garret Reddish MD.

## 2018-10-15 NOTE — Telephone Encounter (Signed)
Pt last OV was 10/15/2018  Pt last refill was 07/03/2017 Qty. 30 R 1   Okay for refill?  Please advise

## 2018-10-16 ENCOUNTER — Other Ambulatory Visit: Payer: 59

## 2018-10-16 DIAGNOSIS — Z20822 Contact with and (suspected) exposure to covid-19: Secondary | ICD-10-CM

## 2018-10-17 LAB — NOVEL CORONAVIRUS, NAA: SARS-CoV-2, NAA: NOT DETECTED

## 2018-10-30 ENCOUNTER — Ambulatory Visit (INDEPENDENT_AMBULATORY_CARE_PROVIDER_SITE_OTHER): Payer: 59 | Admitting: Family Medicine

## 2018-10-30 ENCOUNTER — Encounter: Payer: Self-pay | Admitting: Family Medicine

## 2018-10-30 ENCOUNTER — Other Ambulatory Visit: Payer: Self-pay

## 2018-10-30 ENCOUNTER — Ambulatory Visit: Payer: Self-pay | Admitting: Family Medicine

## 2018-10-30 DIAGNOSIS — R42 Dizziness and giddiness: Secondary | ICD-10-CM

## 2018-10-30 MED ORDER — MECLIZINE HCL 25 MG PO TABS: 25.0000 mg | ORAL_TABLET | ORAL | 0 refills | Status: DC | PRN

## 2018-10-30 NOTE — Progress Notes (Signed)
Subjective:    Patient ID: Dakota Barker, male    DOB: 30-Mar-1956, 63 y.o.   MRN: [id number]  HPI Virtual Visit via Video Note  I connected with the patient on 10/30/18 at  3:15 PM EDT by a video enabled telemedicine application and verified that I am speaking with the correct person using two identifiers.  Location patient: home Location provider:work or home office Persons participating in the virtual visit: patient, provider  I discussed the limitations of evaluation and management by telemedicine and the availability of in person appointments. The patient expressed understanding and agreed to proceed.   HPI: Here for 3 days of intermittent dizziness which is worse when he gets up and down or turns his head quickly. No headache or fever. Using Zyrtec and Flonase.    ROS: See pertinent positives and negatives per HPI.  Past Medical History:  Diagnosis Date  . ADHD (attention deficit hyperactivity disorder)    no treatment  . Allergy    seasonal  . Hematuria   . Hyperlipidemia   . Skin cancer    nose- radiation only. Cared for regularly Dr. Wilhemina Bonito.  . Tremor    benign, essential    Past Surgical History:  Procedure Laterality Date  . [place]   right  . left heel surgery     Dr. Noemi Chapel.around 2000    Family History  Problem Relation Age of Onset  . Diabetes Mother   . Congestive Heart Failure Mother        passed at 64  . Aortic aneurysm Mother   . Hypertension Mother   . Stroke Mother        31  . Breast cancer Father 68  . Diabetes Brother   . Thyroid cancer Brother   . Other Brother        adopted  . Colon cancer Cousin      Current Outpatient Medications:  .  aspirin EC 81 MG tablet, Take 1 tablet (81 mg total) by mouth daily., Disp: , Rfl:  .  atorvastatin (LIPITOR) 20 MG tablet, Take 1 tablet (20 mg total) by mouth daily., Disp: 90 tablet, Rfl: 3 .  cetirizine (ZYRTEC) 10 MG chewable tablet, Chew 10 mg by mouth daily as needed  for allergies. , Disp: , Rfl:  .  diazepam (VALIUM) 5 MG tablet, TAKE 1 TABLET AS NEEDED FOR FLYING., Disp: 30 tablet, Rfl: 0 .  fish oil-omega-3 fatty acids 1000 MG capsule, Take 2 g by mouth daily.  , Disp: , Rfl:  .  fluticasone (FLONASE) 50 MCG/ACT nasal spray, Place 2 sprays into both nostrils daily., Disp: 16 g, Rfl: 0 .  ibuprofen (ADVIL,MOTRIN) 200 MG tablet, Take 600-800 mg by mouth every 6 (six) hours as needed for moderate pain., Disp: , Rfl:  .  montelukast (SINGULAIR) 10 MG tablet, Take 1 tablet (10 mg total) by mouth at bedtime., Disp: 30 tablet, Rfl: 1 .  Multiple Vitamin (MULTIVITAMIN) tablet, Take 1 tablet by mouth daily.  , Disp: , Rfl:  .  tadalafil (ADCIRCA/CIALIS) 20 MG tablet, TAKE 1 TABLET BY MOUTH DAILY IF NEEDED FOR ERECTILE DYSFUNCTION, Disp: 15 tablet, Rfl: 2  EXAM:  VITALS per patient if applicable:  GENERAL: alert, oriented, appears well and in no acute distress  HEENT: atraumatic, conjunttiva clear, no obvious abnormalities on inspection of external nose and ears  NECK: normal movements of the head and neck  LUNGS: on inspection no signs of respiratory distress, breathing  rate appears normal, no obvious gross SOB, gasping or wheezing  CV: no obvious cyanosis  MS: moves all visible extremities without noticeable abnormality  PSYCH/NEURO: pleasant and cooperative, no obvious depression or anxiety, speech and thought processing grossly intact  ASSESSMENT AND PLAN: Vertigo, add Meclizine as needed. Drink plenty of fluids. Recheck prn.  Alysia Penna, MD  Discussed the following assessment and plan:  No diagnosis found.     I discussed the assessment and treatment plan with the patient. The patient was provided an opportunity to ask questions and all were answered. The patient agreed with the plan and demonstrated an understanding of the instructions.   The patient was advised to call back or seek an in-person evaluation if the symptoms worsen or if the  condition fails to improve as anticipated.     Review of Systems     Objective:   Physical Exam        Assessment & Plan:

## 2018-10-30 NOTE — Telephone Encounter (Signed)
See note

## 2018-10-30 NOTE — Telephone Encounter (Signed)
Hi Madelyn,  Pls see note below and could you pls call patient and schedule them for appt @ [facility] w/any provider that has an opening today.  Thanks!

## 2018-10-30 NOTE — Telephone Encounter (Signed)
Pt. Reports he started having dizziness Monday. Sometimes he feels like he is spinning. States "my equilibrium is off." Has some nausea with this. No other symptoms. Request a virtual visit. Please advise pt. [phone].   Answer Assessment - Initial Assessment Questions 1. DESCRIPTION: "Describe your dizziness."     Dizzy 2. LIGHTHEADED: "Do you feel lightheaded?" (e.g., somewhat faint, woozy, weak upon standing)     Yes 3. VERTIGO: "Do you feel like either you or the room is spinning or tilting?" (i.e. vertigo)     Sometimes 4. SEVERITY: "How bad is it?"  "Do you feel like you are going to faint?" "Can you stand and walk?"   - MILD - walking normally   - MODERATE - interferes with normal activities (e.g., work, school)    - SEVERE - unable to stand, requires support to walk, feels like passing out now.      Moderate 5. ONSET:  "When did the dizziness begin?"     Monday 6. AGGRAVATING FACTORS: "Does anything make it worse?" (e.g., standing, change in head position)     Changing positions 7. HEART RATE: "Can you tell me your heart rate?" "How many beats in 15 seconds?"  (Note: not all patients can do this)       No 8. CAUSE: "What do you think is causing the dizziness?"     Unsure 9. RECURRENT SYMPTOM: "Have you had dizziness before?" If so, ask: "When was the last time?" "What happened that time?"     Yes 10. OTHER SYMPTOMS: "Do you have any other symptoms?" (e.g., fever, chest pain, vomiting, diarrhea, bleeding)       No 11. PREGNANCY: "Is there any chance you are pregnant?" "When was your last menstrual period?"       n/a  Protocols used: DIZZINESS [facility]

## 2018-11-13 ENCOUNTER — Ambulatory Visit (INDEPENDENT_AMBULATORY_CARE_PROVIDER_SITE_OTHER): Payer: 59

## 2018-11-13 ENCOUNTER — Other Ambulatory Visit: Payer: 59

## 2018-11-13 DIAGNOSIS — R05 Cough: Secondary | ICD-10-CM | POA: Diagnosis not present

## 2018-11-13 DIAGNOSIS — R059 Cough, unspecified: Secondary | ICD-10-CM

## 2018-11-13 NOTE — Addendum Note (Signed)
Addended by: Gwenyth Ober R on: 11/13/2018 12:21 PM   Modules accepted: Orders

## 2018-11-14 ENCOUNTER — Encounter: Payer: Self-pay | Admitting: Family Medicine

## 2018-11-18 NOTE — Telephone Encounter (Signed)
Please call pt and schedule appt with Dr. Yong Channel.

## 2018-11-28 ENCOUNTER — Ambulatory Visit (INDEPENDENT_AMBULATORY_CARE_PROVIDER_SITE_OTHER): Payer: 59 | Admitting: Family Medicine

## 2018-11-28 ENCOUNTER — Encounter: Payer: Self-pay | Admitting: Family Medicine

## 2018-11-28 VITALS — Ht 70.5 in | Wt 218.0 lb

## 2018-11-28 DIAGNOSIS — R05 Cough: Secondary | ICD-10-CM

## 2018-11-28 DIAGNOSIS — F17200 Nicotine dependence, unspecified, uncomplicated: Secondary | ICD-10-CM | POA: Diagnosis not present

## 2018-11-28 DIAGNOSIS — J301 Allergic rhinitis due to pollen: Secondary | ICD-10-CM

## 2018-11-28 DIAGNOSIS — R053 Chronic cough: Secondary | ICD-10-CM

## 2018-11-28 MED ORDER — FLUTICASONE PROPIONATE 50 MCG/ACT NA SUSP: 2.0000 | Freq: Every day | NASAL | 1 refills | Status: DC

## 2018-11-28 NOTE — Patient Instructions (Signed)
There are no preventive care reminders to display for this patient.  Depression screen [facility] 2/9 10/15/2018 09/05/2017 06/04/2017  Decreased Interest 0 0 0  Down, Depressed, Hopeless 0 0 0  PHQ - 2 Score 0 0 0

## 2018-11-28 NOTE — Progress Notes (Signed)
Phone [phone]   Subjective:  Virtual visit via Video note. Chief complaint: Chief Complaint  Patient presents with  . Cough    Productive cough with clear phglem. Sx started 1-2 month.   This visit type was conducted due to national recommendations for restrictions regarding the COVID-19 Pandemic (e.g. social distancing).  This format is felt to be most appropriate for this patient at this time balancing risks to patient and risks to population by having him in for in person visit.  No physical exam was performed (except for noted visual exam or audio findings with Telehealth visits).    Our team/I connected with [name] at  2:20 PM EDT by a video enabled telemedicine application (doxy.me or caregility through epic) and verified that I am speaking with the correct person using two identifiers.  Location patient: [place] Location provider: [facility], office Persons participating in the virtual visit:  patient  Our team/I discussed the limitations of evaluation and management by telemedicine and the availability of in person appointments. In light of current covid-19 pandemic, patient also understands that we are trying to protect them by minimizing in office contact if at all possible.  The patient expressed consent for telemedicine visit and agreed to proceed. Patient understands insurance will be billed.   ROS-no runny nose, no sneezing.  No watery itchy eyes.  No chest pain or shortness of breath reported.  Occasional wheezing.  Past Medical History-  Patient Active Problem List   Diagnosis Date Noted  . Tobacco use disorder 02/23/2009    Priority: High  . Anxiety state 01/03/2016    Priority: Medium  . Palpitations 06/05/2013    Priority: Medium  . Hyperlipidemia, mild 12/10/2007    Priority: Medium  . Benign essential tremor 12/10/2007    Priority: Medium  . Reactive airway disease 08/03/2015    Priority: Low  . Erectile dysfunction 03/05/2014    Priority:  Low  . Microscopic hematuria 12/10/2007    Priority: Low  . Allergic rhinitis 01/04/2007    Priority: Low  . 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 aspirin EC 81 MG tablet Take 1 tablet (81 mg total) by mouth daily.    Marland Kitchen atorvastatin (LIPITOR) 20 MG tablet Take 1 tablet (20 mg total) by mouth daily. 90 tablet 3  . cetirizine (ZYRTEC) 10 MG chewable tablet Chew 10 mg by mouth daily as needed for allergies.     . diazepam (VALIUM) 5 MG tablet TAKE 1 TABLET AS NEEDED FOR FLYING. 30 tablet 0  . fish oil-omega-3 fatty acids 1000 MG capsule Take 2 g by mouth daily.      . fluticasone (FLONASE) 50 MCG/ACT nasal spray Place 2 sprays into both nostrils daily. 16 g 1  . ibuprofen (ADVIL,MOTRIN) 200 MG tablet Take 600-800 mg by mouth every 6 (six) hours as needed for moderate pain.    . meclizine (ANTIVERT) 25 MG tablet Take 1 tablet (25 mg total) by mouth every 4 (four) hours as needed for dizziness. 60 tablet 0  . montelukast (SINGULAIR) 10 MG tablet Take 1 tablet (10 mg total) by mouth at bedtime. 30 tablet 1  . Multiple Vitamin (MULTIVITAMIN) tablet Take 1 tablet by mouth daily.      . tadalafil (ADCIRCA/CIALIS) 20 MG tablet TAKE 1 TABLET BY MOUTH DAILY IF NEEDED FOR ERECTILE DYSFUNCTION 15 tablet 2   No current facility-administered medications  for this visit.      Objective:  Ht 5' 10.5" (1.791 m)   Wt 218 lb (98.9 kg)   BMI 30.84 kg/m  self reported vitals Gen: NAD, resting comfortably-intermittent cough during visit Lungs: nonlabored, normal respiratory rate  Skin: appears dry, no obvious rash     Assessment and Plan   # Cough S: Patient is complaining of cough for approximately 2 months.  It is a productive cough but with clear sputum.   For allergies he used to take Singulair and Zyrtec as well as Flonase. Normally stops taking these meds around June- usually  grass allergy- he stopped this previously. No runny nose or watery itchy eyes or sneezing.   Has never had asthma and not having much wheezing.   No ace-inhibitor.  No reported issues with reflux  Possible smokers cough.  A/P: 63 year old male intermittent cigar smoker with now 2 months of cough.  Does have a history of allergic rhinitis-we are going to trial Zyrtec and Flonase together for 2 to 3 weeks.  If he fails to improve within that timeframe we will likely add omeprazole 40 mg for another 2-week trial for possible silent reflux.  Due to long-term history of smoking, we opted also when he is back in town to get a chest CT noncontrast-he will contact me when he is traveling back into the area and we will get this set up for him.  He did have a chest x-ray on July 8th 2020 Which was reassuring.  Patient is very irritable on prednisone and wants to avoid this as well due to weight gain potential  Recommended follow up: As needed for this acute issue  Lab/Order associations:   ICD-10-CM   1. Chronic cough  R05   2. Tobacco use disorder  F17.200     Meds ordered this encounter  Medications  . fluticasone (FLONASE) 50 MCG/ACT nasal spray    Sig: Place 2 sprays into both nostrils daily.    Dispense:  16 g    Refill:  1    Return precautions advised.  Garret Reddish, MD

## 2018-12-11 ENCOUNTER — Other Ambulatory Visit: Payer: Self-pay | Admitting: Family Medicine

## 2018-12-11 DIAGNOSIS — F4323 Adjustment disorder with mixed anxiety and depressed mood: Secondary | ICD-10-CM

## 2018-12-11 NOTE — Telephone Encounter (Signed)
Last OV 11/28/18 Last refill 10/15/18 #30/0 Next OV not scheduled   Forwarding to Dr. Yong Channel

## 2019-02-05 ENCOUNTER — Other Ambulatory Visit: Payer: Self-pay | Admitting: Family Medicine

## 2019-03-20 ENCOUNTER — Other Ambulatory Visit: Payer: Self-pay

## 2019-03-20 ENCOUNTER — Encounter: Payer: Self-pay | Admitting: Family Medicine

## 2019-03-20 DIAGNOSIS — Z20828 Contact with and (suspected) exposure to other viral communicable diseases: Secondary | ICD-10-CM

## 2019-03-20 DIAGNOSIS — Z20822 Contact with and (suspected) exposure to covid-19: Secondary | ICD-10-CM

## 2019-03-21 ENCOUNTER — Other Ambulatory Visit: Payer: Self-pay

## 2019-03-21 DIAGNOSIS — Z20822 Contact with and (suspected) exposure to covid-19: Secondary | ICD-10-CM

## 2019-03-24 ENCOUNTER — Encounter: Payer: Self-pay | Admitting: Family Medicine

## 2019-03-24 ENCOUNTER — Ambulatory Visit (INDEPENDENT_AMBULATORY_CARE_PROVIDER_SITE_OTHER): Payer: 59 | Admitting: Family Medicine

## 2019-03-24 VITALS — Ht 70.5 in | Wt 220.0 lb

## 2019-03-24 DIAGNOSIS — M5442 Lumbago with sciatica, left side: Secondary | ICD-10-CM | POA: Diagnosis not present

## 2019-03-24 LAB — NOVEL CORONAVIRUS, NAA: SARS-CoV-2, NAA: NOT DETECTED

## 2019-03-24 MED ORDER — CYCLOBENZAPRINE HCL 10 MG PO TABS: 5.0000 mg | ORAL_TABLET | Freq: Every evening | ORAL | 0 refills | Status: DC | PRN

## 2019-03-24 NOTE — Telephone Encounter (Signed)
App made for this afternoon

## 2019-03-24 NOTE — Progress Notes (Signed)
Phone [phone] Virtual visit via Video note   Subjective:  Chief complaint: Chief Complaint  Patient presents with   Back Pain    This visit type was conducted due to national recommendations for restrictions regarding the COVID-19 Pandemic (e.g. social distancing).  This format is felt to be most appropriate for this patient at this time balancing risks to patient and risks to population by having him in for in person visit.  No physical exam was performed (except for noted visual exam or audio findings with Telehealth visits).    Our team/I connected with [name] at  3:40 PM EST by a video enabled telemedicine application (doxy.me or caregility through epic) and verified that I am speaking with the correct person using two identifiers.  Location patient: [facility] Location provider: [facility], office Persons participating in the virtual visit:  patient  Our team/I discussed the limitations of evaluation and management by telemedicine and the availability of in person appointments. In light of current covid-19 pandemic, patient also understands that we are trying to protect them by minimizing in office contact if at all possible.  The patient expressed consent for telemedicine visit and agreed to proceed. Patient understands insurance will be billed.   ROS-no fever/chills/cough/congestion.  Complains of back pain without weakness.  No reported incontinence issues.  Did not report midline back pain.  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     atorvastatin (LIPITOR) 20 MG tablet TAKE 1 TABLET ONCE DAILY. 90 tablet 0   cetirizine (ZYRTEC) 10 MG chewable tablet Chew 10 mg by mouth daily as needed for allergies.      diazepam (VALIUM) 5 MG tablet TAKE 1 TABLET AS NEEDED FOR FLYING. 30 tablet 0   fish oil-omega-3 fatty acids 1000 MG capsule Take 2 g by mouth daily.       fluticasone (FLONASE) 50 MCG/ACT nasal spray Place 2 sprays into both nostrils daily. 16 g 1   ibuprofen (ADVIL,MOTRIN) 200 MG tablet Take 600-800 mg by mouth every 6 (six) hours as needed for moderate pain.     meclizine (ANTIVERT) 25 MG tablet Take 1 tablet (25 mg total) by mouth every 4 (four) hours as needed for dizziness. 60 tablet 0   montelukast (SINGULAIR) 10 MG tablet Take 1 tablet (10 mg total) by mouth at bedtime. 30 tablet 1   Multiple Vitamin (MULTIVITAMIN) tablet Take 1 tablet by mouth daily.       tadalafil (ADCIRCA/CIALIS) 20 MG tablet TAKE 1 TABLET BY MOUTH DAILY IF NEEDED FOR ERECTILE DYSFUNCTION 15 tablet 2   cyclobenzaprine (FLEXERIL) 10 MG tablet Take 0.5-1 tablets (5-10 mg total) by mouth at bedtime as needed for muscle  spasms (do not drive for 8 hours after taking). 20 tablet 0   No current facility-administered medications for this visit.      Objective:  Ht 5' 10.5" (1.791 m)    Wt 220 lb (99.8 kg)    BMI 31.12 kg/m  self reported vitals Gen: NAD, resting comfortably in chair Lungs: nonlabored, normal respiratory rate  Skin: appears dry, no obvious rash Points to left low back as source of pain several centimeters lateral to the midline-no midline pain reported.  FABER testworsens pain     Assessment and Plan   Left low  back Pain S:pt c/o moderate to severe aching lower back pain that started last Wednesday morning after sitting in a low chair and he moved a certain way and it pulled a little bit in left low back. Seems to have worsened since that time with associated aching and it sometimes radiates to his lower left leg. Gets shooting pain in that area at times. Can lift right leg without issues- left leg when lifts it bothers him. If leads with left foot going up stairs has pain with each step- does ok if trails with left foot.  He thinks its just his muscle primarily in the left low back causing the severe pain, he has been only taking tylenol and that's not helping and he is not sleeping at night due to the pain.  A/P: No obvious red flags such as incontinence or midline tenderness.  No history of back pain.  No reported obvious fall or trauma. -Patient could be suffering from SI joint disease with positive Corky Sox or facet joint arthritis flareup with associated muscle spasm -Regardless he would like to try a muscle relaxant to start given all the muscle spasm in the left low back-encouraged him to try Flexeril at bedtime-half tablet to start and full tablet if needed.  No driving for 8 hours after taking Flexeril -We also discussed options for anti-inflammatories-he would like to try Aleve each morning.  Does have some radiation into the left leg and potentially could try prednisone-he will call back if not improving by Friday to try this  Recommended follow up: As needed for acute concern  Lab/Order associations:   ICD-10-CM   1. Acute left-sided low back pain with left-sided sciatica  M54.42     Meds ordered this encounter  Medications   cyclobenzaprine (FLEXERIL) 10 MG tablet    Sig: Take 0.5-1 tablets (5-10 mg total) by mouth at bedtime as needed for muscle spasms (do not drive for 8 hours after taking).    Dispense:  20 tablet    Refill:  0    Return precautions advised.  Garret Reddish, MD

## 2019-03-24 NOTE — Patient Instructions (Addendum)
Health Maintenance Due  Topic Date Due  . INFLUENZA VACCINE -will discuss next OV 12/07/2018   Depression screen [facility] 2/9 10/15/2018 09/05/2017 06/04/2017  Decreased Interest 0 0 0  Down, Depressed, Hopeless 0 0 0  PHQ - 2 Score 0 0 0

## 2019-03-28 ENCOUNTER — Encounter: Payer: Self-pay | Admitting: Family Medicine

## 2019-03-28 DIAGNOSIS — R05 Cough: Secondary | ICD-10-CM

## 2019-03-28 DIAGNOSIS — R053 Chronic cough: Secondary | ICD-10-CM

## 2019-03-28 DIAGNOSIS — R059 Cough, unspecified: Secondary | ICD-10-CM

## 2019-04-09 ENCOUNTER — Inpatient Hospital Stay: Admission: RE | Admit: 2019-04-09 | Payer: 59 | Source: Ambulatory Visit

## 2019-04-10 ENCOUNTER — Other Ambulatory Visit: Payer: Self-pay

## 2019-04-10 ENCOUNTER — Ambulatory Visit (INDEPENDENT_AMBULATORY_CARE_PROVIDER_SITE_OTHER)
Admission: RE | Admit: 2019-04-10 | Discharge: 2019-04-10 | Disposition: A | Discharge status: Home / Self Care | Payer: 59 | Source: Ambulatory Visit | Attending: Family Medicine | Admitting: Family Medicine

## 2019-04-10 DIAGNOSIS — R05 Cough: Secondary | ICD-10-CM

## 2019-04-10 DIAGNOSIS — R053 Chronic cough: Secondary | ICD-10-CM

## 2019-04-14 ENCOUNTER — Other Ambulatory Visit: Payer: Self-pay | Admitting: Family Medicine

## 2019-05-08 ENCOUNTER — Encounter: Payer: Self-pay | Admitting: Family Medicine

## 2019-05-12 ENCOUNTER — Encounter: Payer: Self-pay | Admitting: Family Medicine

## 2019-05-12 NOTE — Telephone Encounter (Signed)
See other message

## 2019-05-14 ENCOUNTER — Encounter: Payer: Self-pay | Admitting: Family Medicine

## 2019-05-14 ENCOUNTER — Telehealth: Payer: Self-pay | Admitting: Family Medicine

## 2019-05-14 NOTE — Telephone Encounter (Signed)
Think we have received my chart message as well regarding this

## 2019-05-14 NOTE — Telephone Encounter (Signed)
Copied from [place] [id number]. Topic: General - Inquiry >> May 14, 2019 11:58 AM Virl Axe D wrote: Reason for CRM: Pt would like to have Covid 19 antibody testing done. Pt's wife Beauford Roerig 09[birth year]08/57 sees Dr. Regis Bill at [facility] and would they would like to have testing done in the same office. Advised that is up to each respective provider.

## 2019-05-15 NOTE — Telephone Encounter (Signed)
Okay-please verify with patient that he got/understood the message

## 2019-05-23 ENCOUNTER — Ambulatory Visit: Payer: 59 | Attending: Internal Medicine

## 2019-05-23 DIAGNOSIS — Z23 Encounter for immunization: Secondary | ICD-10-CM | POA: Insufficient documentation

## 2019-05-23 NOTE — Progress Notes (Signed)
   Covid-19 Vaccination Clinic  Name:  Dakota Barker    MRN: [id number] DOB: 14-Apr-1956  05/23/2019  [name] was observed post Covid-19 immunization for 15 minutes without incidence. He was provided with Vaccine Information Sheet and instruction to access the V-Safe system.   [name] was instructed to call 911 with any severe reactions post vaccine: Marland Kitchen Difficulty breathing  . Swelling of your face and throat  . A fast heartbeat  . A bad rash all over your body  . Dizziness and weakness    Immunizations Administered    Name Date Dose VIS Date Route   Pfizer COVID-19 Vaccine 05/23/2019 12:30 PM 0.3 mL 04/18/2019 Intramuscular   Manufacturer: [place]   Lot: [id number]   [place]: [id number]

## 2019-06-13 ENCOUNTER — Ambulatory Visit: Payer: 59 | Attending: Internal Medicine

## 2019-06-13 DIAGNOSIS — Z23 Encounter for immunization: Secondary | ICD-10-CM

## 2019-06-13 NOTE — Progress Notes (Signed)
° °  Covid-19 Vaccination Clinic  Name:  Dakota Barker    MRN: [id number] DOB: Oct 05, 1955  06/13/2019  [name] was observed post Covid-19 immunization for 15 minutes without incidence. He was provided with Vaccine Information Sheet and instruction to access the V-Safe system.   [name] was instructed to call 911 with any severe reactions post vaccine:  Difficulty breathing   Swelling of your face and throat   A fast heartbeat   A bad rash all over your body   Dizziness and weakness    Immunizations Administered    Name Date Dose VIS Date Route   Pfizer COVID-19 Vaccine 06/13/2019 12:21 PM 0.3 mL 04/18/2019 Intramuscular   Manufacturer: [place]   Lot: [id number]   [place]: [id number]

## 2019-06-23 ENCOUNTER — Other Ambulatory Visit: Payer: Self-pay | Admitting: Family Medicine

## 2019-07-05 ENCOUNTER — Encounter: Payer: Self-pay | Admitting: Family Medicine

## 2019-07-10 ENCOUNTER — Ambulatory Visit (INDEPENDENT_AMBULATORY_CARE_PROVIDER_SITE_OTHER): Payer: 59 | Admitting: Family Medicine

## 2019-07-10 ENCOUNTER — Encounter: Payer: Self-pay | Admitting: Family Medicine

## 2019-07-10 ENCOUNTER — Other Ambulatory Visit: Payer: Self-pay

## 2019-07-10 VITALS — BP 130/80 | HR 62 | Temp 96.6°F | Ht 71.0 in | Wt 220.4 lb

## 2019-07-10 DIAGNOSIS — Z125 Encounter for screening for malignant neoplasm of prostate: Secondary | ICD-10-CM

## 2019-07-10 DIAGNOSIS — F17200 Nicotine dependence, unspecified, uncomplicated: Secondary | ICD-10-CM

## 2019-07-10 DIAGNOSIS — R911 Solitary pulmonary nodule: Secondary | ICD-10-CM

## 2019-07-10 DIAGNOSIS — Z833 Family history of diabetes mellitus: Secondary | ICD-10-CM | POA: Diagnosis not present

## 2019-07-10 DIAGNOSIS — IMO0001 Reserved for inherently not codable concepts without codable children: Secondary | ICD-10-CM

## 2019-07-10 DIAGNOSIS — H8111 Benign paroxysmal vertigo, right ear: Secondary | ICD-10-CM | POA: Diagnosis not present

## 2019-07-10 DIAGNOSIS — E785 Hyperlipidemia, unspecified: Secondary | ICD-10-CM

## 2019-07-10 DIAGNOSIS — H8110 Benign paroxysmal vertigo, unspecified ear: Secondary | ICD-10-CM | POA: Insufficient documentation

## 2019-07-10 LAB — CBC WITH DIFFERENTIAL/PLATELET
Basophils Absolute: 0 10*3/uL (ref 0.0–0.1)
Basophils Relative: 0.5 % (ref 0.0–3.0)
Eosinophils Absolute: 0.3 10*3/uL (ref 0.0–0.7)
Eosinophils Relative: 5.4 % — ABNORMAL HIGH (ref 0.0–5.0)
HCT: 46.3 % (ref 39.0–52.0)
Hemoglobin: 15.5 g/dL (ref 13.0–17.0)
Lymphocytes Relative: 37.4 % (ref 12.0–46.0)
Lymphs Abs: 2.3 10*3/uL (ref 0.7–4.0)
MCHC: 33.5 g/dL (ref 30.0–36.0)
MCV: 97.2 fl (ref 78.0–100.0)
Monocytes Absolute: 0.7 10*3/uL (ref 0.1–1.0)
Monocytes Relative: 11.2 % (ref 3.0–12.0)
Neutro Abs: 2.8 10*3/uL (ref 1.4–7.7)
Neutrophils Relative %: 45.5 % (ref 43.0–77.0)
Platelets: 242 10*3/uL (ref 150.0–400.0)
RBC: 4.76 Mil/uL (ref 4.22–5.81)
RDW: 13 % (ref 11.5–15.5)
WBC: 6.2 10*3/uL (ref 4.0–10.5)

## 2019-07-10 LAB — COMPREHENSIVE METABOLIC PANEL
ALT: 25 U/L (ref 0–53)
AST: 25 U/L (ref 0–37)
Albumin: 4.3 g/dL (ref 3.5–5.2)
Alkaline Phosphatase: 59 U/L (ref 39–117)
BUN: 17 mg/dL (ref 6–23)
CO2: 28 mEq/L (ref 19–32)
Calcium: 9.7 mg/dL (ref 8.4–10.5)
Chloride: 103 mEq/L (ref 96–112)
Creatinine, Ser: 1.03 mg/dL (ref 0.40–1.50)
GFR: 72.99 mL/min (ref >60.00)
Glucose, Bld: 98 mg/dL (ref 70–99)
Potassium: 4.7 mEq/L (ref 3.5–5.1)
Sodium: 138 mEq/L (ref 135–145)
Total Bilirubin: 0.7 mg/dL (ref 0.2–1.2)
Total Protein: 6.9 g/dL (ref 6.0–8.3)

## 2019-07-10 LAB — HEMOGLOBIN A1C: Hgb A1c MFr Bld: 5.4 % (ref 4.6–6.5)

## 2019-07-10 LAB — LIPID PANEL
Cholesterol: 153 mg/dL (ref 0–200)
HDL: 40.8 mg/dL (ref >39.00)
LDL Cholesterol: 92 mg/dL (ref 0–99)
NonHDL: 112.19
Total CHOL/HDL Ratio: 4
Triglycerides: 102 mg/dL (ref 0.0–149.0)
VLDL: 20.4 mg/dL (ref 0.0–40.0)

## 2019-07-10 LAB — PSA: PSA: 1.33 ng/mL (ref 0.10–4.00)

## 2019-07-10 MED ORDER — ATORVASTATIN CALCIUM 20 MG PO TABS: 20.0000 mg | ORAL_TABLET | Freq: Every day | ORAL | 3 refills | Status: DC

## 2019-07-10 NOTE — Progress Notes (Signed)
Phone [phone] In person visit   Subjective:   Dakota Barker is a 64 y.o. year old very pleasant male patient who presents for/with See problem oriented charting Chief Complaint  Patient presents with  . Dizziness   This visit occurred during the SARS-CoV-2 public health emergency.  Safety protocols were in place, including screening questions prior to the visit, additional usage of staff PPE, and extensive cleaning of exam room while observing appropriate contact time as indicated for disinfecting solutions.   Past Medical History-  Patient Active Problem List   Diagnosis Date Noted  . Tobacco use disorder 02/23/2009    Priority: High  . Benign paroxysmal positional vertigo 07/10/2019    Priority: Medium  . Anxiety state 01/03/2016    Priority: Medium  . Palpitations 06/05/2013    Priority: Medium  . Hyperlipidemia, mild 12/10/2007    Priority: Medium  . Benign essential tremor 12/10/2007    Priority: Medium  . Reactive airway disease 08/03/2015    Priority: Low  . Erectile dysfunction 03/05/2014    Priority: Low  . Microscopic hematuria 12/10/2007    Priority: Low  . Allergic rhinitis 01/04/2007    Priority: Low  . 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 aspirin EC 81 MG tablet Take 1 tablet (81 mg total) by mouth daily.    Marland Kitchen atorvastatin (LIPITOR) 20 MG tablet Take 1 tablet (20 mg total) by mouth daily. 90 tablet 3  . cetirizine (ZYRTEC) 10 MG chewable tablet Chew 10 mg by mouth daily as needed for allergies.     . cyclobenzaprine (FLEXERIL) 10 MG tablet Take 0.5-1 tablets (5-10 mg total) by mouth at bedtime as needed for muscle spasms (do not drive for 8 hours after taking). 20 tablet 0  . diazepam (VALIUM) 5 MG tablet TAKE 1 TABLET AS NEEDED FOR FLYING. 30 tablet 0  . fish oil-omega-3 fatty acids 1000 MG capsule Take 2 g by mouth daily.        . fluticasone (FLONASE) 50 MCG/ACT nasal spray Place 2 sprays into both nostrils daily. 16 g 1  . ibuprofen (ADVIL,MOTRIN) 200 MG tablet Take 600-800 mg by mouth every 6 (six) hours as needed for moderate pain.    . meclizine (ANTIVERT) 25 MG tablet Take 1 tablet (25 mg total) by mouth every 4 (four) hours as needed for dizziness. 60 tablet 0  . Multiple Vitamin (MULTIVITAMIN) tablet Take 1 tablet by mouth daily.      . tadalafil (ADCIRCA/CIALIS) 20 MG tablet TAKE 1 TABLET BY MOUTH DAILY IF NEEDED FOR ERECTILE DYSFUNCTION 15 tablet 2   No current facility-administered medications for this visit.     Objective:  BP 130/80   Pulse 62   Temp (!) 96.6 F (35.9 C) (Temporal)   Ht 5\' 11"  (1.803 m)   Wt 220 lb 6.4 oz (100 kg)   SpO2 98%   BMI 30.74 kg/m  Gen: NAD, resting comfortably CV: RRR no murmurs rubs or gallops Lungs: CTAB no crackles, wheeze, rhonchi Ext: no edema Skin: warm, dry Neuro: CN II-XII intact, sensation and reflexes normal throughout, 5/5 muscle strength in bilateral upper and lower extremities. Normal finger to nose. Normal rapid alternating movements. No pronator drift. Normal romberg. Normal gait.  Patient has some vertigo with Marye Round to the left but no nystagmus.  Has nystagmus and vertigo with Dix-Hallpike to the right  Orthostatic vital  signs negative      Assessment and Plan   # dizziness/imbalance --> vertigo S:  Patient reports that when he bends down and then comes up that he feels off balanced/disequilibrium. Current episode started [place] at beach home about [place]. He feels like he is going side to side when he changes positions from sitting to standing and going from laying to siting. Never happens with turning over in bed or with head turning. Seems to be gradually improving. Has tried meclizine once and no tsure how much it helped. No recent cialis use and no ovious meds that would cause orthostatic symptoms- not on blood pressure medicine.  Feels he is staying reasonably well hydrated. He asks about mold as cause- has new place at the beach and doing some renovations- no known mold.   Had similar issues last June and saw Dr. Sarajane Jews. He thought it was vertigo and he was given meclizine through video visit. Had same issues last year and was given meclizine and it helped that time. He did take last week when he was symptomatic and it was somewhat helpful.   He reflects back and honestly has had issues intermittently with this for years with head positioning.  ROS- No tinnitus or hearing loss. No facial or extremity weakness. No slurred words or trouble swallowing. no blurry vision or double vision. No paresthesias. No confusion or word finding difficulties.   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  - I gave him home video to walk him through crystal relocation- offered starting with vestibular rehab but he would like to try this at home first and if does not improve agrees to vestibular rehab -Neurological exam was reassuring today-risk of stroke very low with this along with positive Dix-Hallpike -We discussed considering neuroimaging only if failed to improve with the above -He can continue to use meclizine as needed -orthostatic vital signs negative  -mom with diabetes as well as older brother - we will check for a1c  #hyperlipidemia S: compliant with atorvastatin 20 mg daily Lab Results  Component Value Date   CHOL 175 03/08/2018   HDL 39.50 03/08/2018   LDLCALC 109 (H) 04/06/2017   LDLDIRECT 120.0 03/08/2018   TRIG 387.0 (H) 03/08/2018   CHOLHDL 4 03/08/2018   A/P: Patient is overdue for lipid panel-he agrees to have this done today.  We will continue atorvastatin with LDL goal at least under 100-consider increasing dose if above this still and also needs to work on healthy  eating/regular exercise   Recommended follow up: No scheduled follow-up planned-physical within 6 months would be reasonable though we are doing most of his blood work today  Lab/Order associations:   ICD-10-CM   1. Benign paroxysmal positional vertigo of right ear  H81.11   2. Hyperlipidemia, mild  E78.5 CBC with Differential/Platelet    Comprehensive metabolic panel    Lipid panel  3. Lung nodule < 6cm on CT  R91.1 CT Chest Wo Contrast  4. Family history of diabetes mellitus  Z83.3 Hemoglobin A1c  5. Screening for prostate cancer  Z12.5 PSA  6. Tobacco use disorder  F17.200     Meds ordered this encounter  Medications  . atorvastatin (LIPITOR) 20 MG tablet    Sig: Take 1 tablet (20 mg total) by mouth daily.    Dispense:  90  tablet    Refill:  3   Time Spent: 31 minutes of total time (10:36 AM- 11: 00 AM, 12:48 PM- 12:55) was spent on the date of the encounter performing the following actions: chart review prior to seeing the patient, obtaining history, performing a medically necessary exam, counseling on the treatment plan, placing orders, and documenting in our EHR.   Return precautions advised.  Garret Reddish, MD

## 2019-07-10 NOTE — Patient Instructions (Addendum)
[employer] [place] Sox MD vertigo- ENT specialist  Try these exercises and if not improving then let me refer to vestibular rehab.   Can also try meclizine if having episodes  New or worsening symptoms please let us know  Please stop by lab before you go If you do not have mychart- we will call you about results within 5 business days of [place] receiving them.  If you have mychart- we will send your results within 3 business days of [place] receiving them.  If abnormal or we want to clarify a result, we will call or mychart you to make sure you receive the message.  If you have questions or concerns or don't hear within 5 business days, please send [place] a message or call us.   We will call you within two weeks about your referral for CT lungs. If you do not hear within 3 weeks, give [place] a call.    Benign Positional Vertigo Vertigo is the feeling that you or your surroundings are moving when they are not. Benign positional vertigo is the most common form of vertigo. This is usually a harmless condition (benign). This condition is positional. This means that symptoms are triggered by certain movements and positions. This condition can be dangerous if it occurs while you are doing something that could cause harm to you or others. This includes activities such as driving or operating machinery. What are the causes? In many cases, the cause of this condition is not known. It may be caused by a disturbance in an area of the inner ear that helps your brain to sense movement and balance. This disturbance can be caused by:  Viral infection (labyrinthitis).  Head injury.  Repetitive motion, such as jumping, dancing, or running. What increases the risk? You are more likely to develop this condition if:  You are a woman.  You are 40 years of age or older. What are the signs or symptoms? Symptoms of this condition usually happen when you move your head or your eyes in  different directions. Symptoms may start suddenly, and usually last for less than a minute. They include:  Loss of balance and falling.  Feeling like you are spinning or moving.  Feeling like your surroundings are spinning or moving.  Nausea and vomiting.  Blurred vision.  Dizziness.  Involuntary eye movement (nystagmus). Symptoms can be mild and cause only minor problems, or they can be severe and interfere with daily life. Episodes of benign positional vertigo may return (recur) over time. Symptoms may improve over time. How is this diagnosed? This condition may be diagnosed based on:  Your medical history.  Physical exam of the head, neck, and ears.  Tests, such as: ? MRI. ? CT scan. ? Eye movement tests. Your health care provider may ask you to change positions quickly while he or she watches you for symptoms of benign positional vertigo, such as nystagmus. Eye movement may be tested with a variety of exams that are designed to evaluate or stimulate vertigo. ? An electroencephalogram (EEG). This records electrical activity in your brain. ? Hearing tests. You may be referred to a health care provider who specializes in ear, nose, and throat (ENT) problems (otolaryngologist) or a provider who specializes in disorders of the nervous system (neurologist). How is this treated?  This condition may be treated in a session in which your health care provider moves your head in specific positions to adjust your inner ear back to normal. Treatment for this condition  may take several sessions. Surgery may be needed in severe cases, but this is rare. In some cases, benign positional vertigo may resolve on its own in 2-4 weeks. Follow these instructions at home: Safety  Move slowly. Avoid sudden body or head movements or certain positions, as told by your health care provider.  Avoid driving until your health care provider says it is safe for you to do so.  Avoid operating heavy  machinery until your health care provider says it is safe for you to do so.  Avoid doing any tasks that would be dangerous to you or others if vertigo occurs.  If you have trouble walking or keeping your balance, try using a cane for stability. If you feel dizzy or unstable, sit down right away.  Return to your normal activities as told by your health care provider. Ask your health care provider what activities are safe for you. General instructions  Take over-the-counter and prescription medicines only as told by your health care provider.  Drink enough fluid to keep your urine pale yellow.  Keep all follow-up visits as told by your health care provider. This is important. Contact a health care provider if:  You have a fever.  Your condition gets worse or you develop new symptoms.  Your family or friends notice any behavioral changes.  You have nausea or vomiting that gets worse.  You have numbness or a "pins and needles" sensation. Get help right away if you:  Have difficulty speaking or moving.  Are always dizzy.  Faint.  Develop severe headaches.  Have weakness in your legs or arms.  Have changes in your hearing or vision.  Develop a stiff neck.  Develop sensitivity to light. Summary  Vertigo is the feeling that you or your surroundings are moving when they are not. Benign positional vertigo is the most common form of vertigo.  The cause of this condition is not known. It may be caused by a disturbance in an area of the inner ear that helps your brain to sense movement and balance.  Symptoms include loss of balance and falling, feeling that you or your surroundings are moving, nausea and vomiting, and blurred vision.  This condition can be diagnosed based on symptoms, physical exam, and other tests, such as MRI, CT scan, eye movement tests, and hearing tests.  Follow safety instructions as told by your health care provider. You will also be told when to contact  your health care provider in case of problems. This information is not intended to replace advice given to you by your health care provider. Make sure you discuss any questions you have with your health care provider. Document Revised: 10/03/2017 Document Reviewed: 10/03/2017 Elsevier Patient Education  [place].

## 2019-07-10 NOTE — Assessment & Plan Note (Signed)
S:  Patient reports that when he bends down and then comes up that he feels off balanced/disequilibrium. Current episode started when at beach home about [place]. He feels like he is going side to side when he changes positions from sitting to standing and going from laying to siting. Never happens with turning over in bed or with head turning. Seems to be gradually improving. Has tried meclizine once and no tsure how much it helped. No recent cialis use and no ovious meds that would cause orthostatic symptoms- not on blood pressure medicine. Feels he is staying reasonably well hydrated. He asks about mold as cause- has new place at the beach and doing some renovations- no known mold.   Had similar issues last June and saw Dr. Sarajane Jews. He thought it was vertigo and he was given meclizine through video visit. Had same issues last year and was given meclizine and it helped that time. He did take last week when he was symptomatic and it was somewhat helpful.   He reflects back and honestly has had issues intermittently with this for years with head positioning.  ROS- No tinnitus or hearing loss. No facial or extremity weakness. No slurred words or trouble swallowing. no blurry vision or double vision. No paresthesias. No confusion or word finding difficulties.   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  - I gave him home video to walk him through crystal relocation- offered starting with vestibular rehab but he would like to try this at home first and if does not improve agrees to vestibular rehab -Neurological exam was reassuring today-risk of stroke very low with this along with positive Dix-Hallpike -We discussed considering neuroimaging only if failed to improve with the above -He can continue to use meclizine as needed -orthostatic vital signs  negative  -mom with diabetes as well as older brother - we will check for a1c

## 2019-07-10 NOTE — Assessment & Plan Note (Signed)
Patient requests of repeat of CT scan done in December for pulmonary nodule at 5 mm.  Patient states he is on a cancer board with Dr. Tammi Klippel who he states recommended he go ahead and have a repeat scan-patient has had a lot of fear about cancer in the lungs-he appears willing to pay the cost exam even if not covered by insurance.  I did tell him even with him being willing to pay there is some risk including radiation-he still thinks the benefits of the scan outweigh the risks.  I told patient when the best things he can do to reduce cancer risk is to quit smoking cigars-he uses this as a stress relief and he thinks this would be very challenging for him.  I still strongly encouraged him to stop smoking

## 2019-07-21 ENCOUNTER — Telehealth: Payer: Self-pay | Admitting: Family Medicine

## 2019-07-21 NOTE — Telephone Encounter (Signed)
Nothing I can add for approval- this is patient preference only to have this done now

## 2019-07-21 NOTE — Telephone Encounter (Signed)
Do you know anything about that

## 2019-07-21 NOTE — Telephone Encounter (Signed)
[facility] is calling in regards to the patients CT scan that was ordered, it was denied and so they are asking for a peer to peer review.

## 2019-07-21 NOTE — Telephone Encounter (Signed)
FYI

## 2019-07-21 NOTE — Telephone Encounter (Signed)
He had a CT in Dec - he has a lung nodule.  They recommended, per their protocol, that a repeat CT be done in a year.  It has only been three months.  I knew this was going to be denied because there is no reason for it to be done now - Dr Yong Channel is aware and the patient is aware - the patient stated that he will pay out of pocket if it was denied.  He said he was going to call his insurance company - I imagine that is why they are calling.  Im not sure what Dr Yong Channel can add to get it approved, but I would make him aware of it.

## 2019-07-28 ENCOUNTER — Other Ambulatory Visit: Payer: Self-pay | Admitting: Radiation Oncology

## 2019-07-28 DIAGNOSIS — Z85828 Personal history of other malignant neoplasm of skin: Secondary | ICD-10-CM

## 2019-07-28 DIAGNOSIS — R911 Solitary pulmonary nodule: Secondary | ICD-10-CM

## 2019-08-06 ENCOUNTER — Telehealth: Payer: Self-pay | Admitting: *Deleted

## 2019-08-06 ENCOUNTER — Inpatient Hospital Stay: Admission: RE | Admit: 2019-08-06 | Payer: 59 | Source: Ambulatory Visit

## 2019-08-06 NOTE — Telephone Encounter (Signed)
Called patient to inform that CT has been cancelled for 08-07-19 due to insurance not paying for test, they will do peer to peer and then it will be rescheduled, lvm for a return call

## 2019-08-07 ENCOUNTER — Ambulatory Visit (HOSPITAL_COMMUNITY): Payer: 59

## 2019-08-20 ENCOUNTER — Ambulatory Visit (HOSPITAL_COMMUNITY): Payer: 59

## 2019-09-26 ENCOUNTER — Telehealth: Payer: Self-pay | Admitting: *Deleted

## 2019-09-26 NOTE — Telephone Encounter (Signed)
CALLED PATIENT TO INFORM OF CT FOR 09-30-19 - ARRIVAL TIME- 12:15 PM @ [facility], NO RESTRICTIONS TO TEST, PATIENT  TO FOLLOW-UP WITH ASHLYN BRUNING FOR RESULTS ON 10-02-19 @ 3 PM, VIA TELEPHONE, LVM FOR A RETURN CALL

## 2019-09-30 ENCOUNTER — Telehealth (INDEPENDENT_AMBULATORY_CARE_PROVIDER_SITE_OTHER): Payer: 59 | Admitting: Family Medicine

## 2019-09-30 ENCOUNTER — Ambulatory Visit (HOSPITAL_COMMUNITY): Admission: RE | Admit: 2019-09-30 | Payer: 59 | Source: Ambulatory Visit

## 2019-09-30 ENCOUNTER — Telehealth: Payer: Self-pay | Admitting: Family Medicine

## 2019-09-30 ENCOUNTER — Encounter: Payer: Self-pay | Admitting: Family Medicine

## 2019-09-30 DIAGNOSIS — M5412 Radiculopathy, cervical region: Secondary | ICD-10-CM | POA: Diagnosis not present

## 2019-09-30 MED ORDER — PREDNISONE 10 MG PO TABS: ORAL_TABLET | ORAL | 0 refills | Status: DC

## 2019-09-30 NOTE — Telephone Encounter (Signed)
PATIENT IS SCHEDULED VIRTUALLY WITH DR.ANDY TODAY...  Nurse Assessment Nurse: Laurena Bering, RN, Helene Kelp Date/Time Eilene Ghazi Time): 09/30/2019 7:55:15 AM Confirm and document reason for call. If symptomatic, describe symptoms. ---Caller states that he played golf 4 days in a row, woke yesterday with a knot on his back, it has moved to the back muscle of his right arm and is now experiencing tingling and numbness in his arm. He is out of town but request virtual visit. Took muscle relaxer yesterday , helped his back but not his arm. Has the patient had close contact with a person known or suspected to have the novel coronavirus illness OR traveled / lives in area with major community spread (including international travel) in the last 14 days from the onset of symptoms? * If Asymptomatic, screen for exposure and travel within the last 14 days. ---No Does the patient have any new or worsening symptoms? ---Yes Will a triage be completed? ---Yes Related visit to physician within the last 2 weeks? ---No Does the PT have any chronic conditions? (i.e. diabetes, asthma, this includes High risk factors for pregnancy, etc.) ---Yes List chronic conditions. ---Elevated cholesterol Is this a behavioral health or substance abuse call? ---NoPLEASE NOTE: All timestamps contained within this report are represented as [place] Standard Time. CONFIDENTIALTY NOTICE: This fax transmission is intended only for the addressee. It contains information that is legally privileged, confidential or otherwise protected from use or disclosure. If you are not the intended recipient, you are strictly prohibited from reviewing, disclosing, copying using or disseminating any of this information or taking any action in reliance on or regarding this information. If you have received this fax in error, please notify us immediately by telephone so that we can arrange for its return to [place]. Phone: [phone], Toll-Free: [phone], Fax:  [phone] Page: 2 of 2 Call Id: [id number] Guidelines Guideline Title Affirmed Question Affirmed Notes Nurse Date/Time Eilene Ghazi Time) Arm Pain Weakness (i.e., loss of strength) in hand or fingers (Exception: not truly weak; hand feels weak because of pain) Laurena Bering, RN, Helene Kelp 09/30/2019 7:57:19 AM Disp. Time Eilene Ghazi Time) Disposition Final User 09/30/2019 7:59:22 AM See HCP within 4 Hours (or PCP triage) Yes Laurena Bering, RN, [place] Artist Disagree/Comply Comply Caller Understands Yes PreDisposition Call Doctor Care Advice Given Per Guideline SEE HCP WITHIN 4 HOURS (OR PCP TRIAGE): CALL BACK IF: * You become worse. CARE ADVICE given per Arm Pain (Adult) guideline. Comments User: Dorothyann Peng, RN Date/Time Eilene Ghazi Time): 09/30/2019 8:05:47 AM Contacted office for virtual appointment. Appointment scheduled for 0900 Referrals REFERRED TO PCP OFFICE

## 2019-09-30 NOTE — Progress Notes (Signed)
Virtual Visit via Video Note  Subjective  CC:  Chief Complaint  Patient presents with  . Spasms    Feels like his right arms is dead weight. Started yesterday. Took 1/2 of muscle relaxer yesterday morning and last night. He stated that he a can feel the spasm in his right shoulder blade. His right hand and arm are numb.      I connected with [name] on 09/30/19 at  9:00 AM EDT by a video enabled telemedicine application and verified that I am speaking with the correct person using two identifiers. Location patient: Home Location provider: [facility] [place], Office Persons participating in the virtual visit: STEPHONE MCCLARNON, Leamon Arnt, MD [place]  I discussed the limitations of evaluation and management by telemedicine and the availability of in person appointments. The patient expressed understanding and agreed to proceed. HPI: Dakota Barker is a 64 y.o. male who was contacted today to address the problems listed above in the chief complaint. 89 64 yo male was in golf tournament (currently at [employer] until next week) and played 4 days straight, ending 2days ago. He noted that night pain and mm knot in right upper back near scapula. He has had this problem before. Massage and flexeril helped improve that  - it is now resolved, however, he notes soreness and pain down the backside of his right arm with numbness in his pinky finger and lateral hand (C8 dermatome). No weakness, no injury. He admits if he flexes his neck and shrugs up his shoulders the pain will go up his right neck. No other sxs: specifically no other paresis, paresthesias, headaches, vision problems, dysarthria. No lower ext sxs or left sided sxs.  Assessment  1. Right cervical radiculopathy      Plan   radiculopathy:  sxs are most c/w with nerve root impingement. Discussed treatment with pred taper and prn flexeril. Pt to f/u for recheck here in office next  week. To UC/ER IF any worsening sxs or new neuro sxs. Tylenol if needed.   I discussed the assessment and treatment plan with the patient. The patient was provided an opportunity to ask questions and all were answered. The patient agreed with the plan and demonstrated an understanding of the instructions.   The patient was advised to call back or seek an in-person evaluation if the symptoms worsen or if the condition fails to improve as anticipated. Follow up: next week for recheck  Visit date not found  Meds ordered this encounter  Medications  . predniSONE (DELTASONE) 10 MG tablet    Sig: Take 4 tabs qd x 2 days, 3 qd x 2 days, 2 qd x 2d, 1qd x 3 days    Dispense:  21 tablet    Refill:  0      I reviewed the patients updated PMH, FH, and SocHx.    Patient Active Problem List   Diagnosis Date Noted  . Benign paroxysmal positional vertigo 07/10/2019  . Basal cell carcinoma (BCC) of nasal tip 06/04/2017  . Anxiety state 01/03/2016  . Reactive airway disease 08/03/2015  . Erectile dysfunction 03/05/2014  . Palpitations 06/05/2013  . Tobacco use disorder 02/23/2009  . Hyperlipidemia, mild 12/10/2007  . Benign essential tremor 12/10/2007  . Microscopic hematuria 12/10/2007  . Allergic rhinitis 01/04/2007   Current Meds  Medication Sig  . aspirin EC 81 MG tablet Take 1 tablet (81 mg total) by mouth  daily.  . atorvastatin (LIPITOR) 20 MG tablet Take 1 tablet (20 mg total) by mouth daily.  . cetirizine (ZYRTEC) 10 MG chewable tablet Chew 10 mg by mouth daily as needed for allergies.   . cyclobenzaprine (FLEXERIL) 10 MG tablet Take 0.5-1 tablets (5-10 mg total) by mouth at bedtime as needed for muscle spasms (do not drive for 8 hours after taking).  . diazepam (VALIUM) 5 MG tablet TAKE 1 TABLET AS NEEDED FOR FLYING.  . fish oil-omega-3 fatty acids 1000 MG capsule Take 2 g by mouth daily.    Marland Kitchen ibuprofen (ADVIL,MOTRIN) 200 MG tablet Take 600-800 mg by mouth every 6 (six) hours as  needed for moderate pain.  . Multiple Vitamin (MULTIVITAMIN) tablet Take 1 tablet by mouth daily.    . tadalafil (ADCIRCA/CIALIS) 20 MG tablet TAKE 1 TABLET BY MOUTH DAILY IF NEEDED FOR ERECTILE DYSFUNCTION    Allergies: Patient has No Known Allergies. Family History: Patient family history includes Aortic aneurysm in his mother; Breast cancer (age of onset: 32) in his father; Colon cancer in his cousin; Congestive Heart Failure in his mother; Diabetes in his brother and mother; Hypertension in his mother; Other in his brother; Stroke in his mother; Thyroid cancer in his brother. Social History:  Patient  reports that he has been smoking cigars. He has smoked for the past 2.00 years. He has never used smokeless tobacco. He reports current alcohol use of about 2.0 standard drinks of alcohol per week. He reports that he does not use drugs.  Review of Systems: Constitutional: Negative for fever malaise or anorexia Cardiovascular: negative for chest pain Respiratory: negative for SOB or persistent cough Gastrointestinal: negative for abdominal pain  OBJECTIVE Vitals: There were no vitals taken for this visit. General: no acute distress , A&Ox3 Appears well. Normal speech and cognitition Neuro: no facial droop Moves neck and right arm in all directions easily.  Points to right C8 dermatome when describing sxs Nl grip, elbow flexion visually.  Leamon Arnt, MD

## 2019-10-02 ENCOUNTER — Ambulatory Visit: Payer: Self-pay | Admitting: Urology

## 2019-10-07 ENCOUNTER — Other Ambulatory Visit: Payer: Self-pay

## 2019-10-07 ENCOUNTER — Ambulatory Visit (INDEPENDENT_AMBULATORY_CARE_PROVIDER_SITE_OTHER): Payer: 59 | Admitting: Family Medicine

## 2019-10-07 ENCOUNTER — Encounter: Payer: Self-pay | Admitting: Family Medicine

## 2019-10-07 VITALS — BP 130/76 | HR 69 | Temp 98.6°F | Ht 71.0 in | Wt 219.4 lb

## 2019-10-07 DIAGNOSIS — M5412 Radiculopathy, cervical region: Secondary | ICD-10-CM | POA: Diagnosis not present

## 2019-10-07 MED ORDER — GABAPENTIN 100 MG PO CAPS: ORAL_CAPSULE | ORAL | 0 refills | Status: DC

## 2019-10-07 NOTE — Progress Notes (Signed)
Phone [phone] In person visit   Subjective:   Dakota Barker is a 64 y.o. year old very pleasant male patient who presents for/with See problem oriented charting Chief Complaint  Patient presents with  . Arm Pain    and numbness right side x 1 week.    This visit occurred during the SARS-CoV-2 public health emergency.  Safety protocols were in place, including screening questions prior to the visit, additional usage of staff PPE, and extensive cleaning of exam room while observing appropriate contact time as indicated for disinfecting solutions.   Past Medical History-  Patient Active Problem List   Diagnosis Date Noted  . Tobacco use disorder 02/23/2009    Priority: High  . Benign paroxysmal positional vertigo 07/10/2019    Priority: Medium  . Anxiety state 01/03/2016    Priority: Medium  . Palpitations 06/05/2013    Priority: Medium  . Hyperlipidemia, mild 12/10/2007    Priority: Medium  . Benign essential tremor 12/10/2007    Priority: Medium  . Reactive airway disease 08/03/2015    Priority: Low  . Erectile dysfunction 03/05/2014    Priority: Low  . Microscopic hematuria 12/10/2007    Priority: Low  . Allergic rhinitis 01/04/2007    Priority: Low  . 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 aspirin EC 81 MG tablet Take 1 tablet (81 mg total) by mouth daily.    Marland Kitchen atorvastatin (LIPITOR) 20 MG tablet Take 1 tablet (20 mg total) by mouth daily. 90 tablet 3  . cetirizine (ZYRTEC) 10 MG chewable tablet Chew 10 mg by mouth daily as needed for allergies.     . cyclobenzaprine (FLEXERIL) 10 MG tablet Take 0.5-1 tablets (5-10 mg total) by mouth at bedtime as needed for muscle spasms (do not drive for 8 hours after taking). 20 tablet 0  . diazepam (VALIUM) 5 MG tablet TAKE 1 TABLET AS NEEDED FOR FLYING. 30 tablet 0  . fish oil-omega-3 fatty acids 1000 MG  capsule Take 2 g by mouth daily.      . fluticasone (FLONASE) 50 MCG/ACT nasal spray Place 2 sprays into both nostrils daily. (Patient not taking: Reported on 09/30/2019) 16 g 1  . ibuprofen (ADVIL,MOTRIN) 200 MG tablet Take 600-800 mg by mouth every 6 (six) hours as needed for moderate pain.    . meclizine (ANTIVERT) 25 MG tablet Take 1 tablet (25 mg total) by mouth every 4 (four) hours as needed for dizziness. (Patient not taking: Reported on 09/30/2019) 60 tablet 0  . Multiple Vitamin (MULTIVITAMIN) tablet Take 1 tablet by mouth daily.      . predniSONE (DELTASONE) 10 MG tablet Take 4 tabs qd x 2 days, 3 qd x 2 days, 2 qd x 2d, 1qd x 3 days 21 tablet 0  . tadalafil (ADCIRCA/CIALIS) 20 MG tablet TAKE 1 TABLET BY MOUTH DAILY IF NEEDED FOR ERECTILE DYSFUNCTION 15 tablet 2   No current facility-administered medications for this visit.     Objective:  BP 130/76   Pulse 69   Temp 98.6 F (37 C) (Temporal)   Ht 5\' 11"  (1.803 m)   Wt 219 lb 6.4 oz (99.5 kg)   SpO2 97%   BMI 30.60 kg/m  Gen: NAD, resting comfortably Neuro: 5 out of 5 strength in left hand.  4 out of 5 strength in the right hand for grip strength.  Spurling test on the  right positive for reproduction of pain, paresthesia into the right arm and hand    Assessment and Plan  #RIght arm pain-concern for cervical radiculopathy  S: Patient has pain down right arm that started last week with numbness. Numbness goes into fingers. Pain is sharp and at this time is 5/10 but can get to 10/10 at night. Patient is on last day of prednisone and has not noticed any improvement.  Patient saw Dr. Jonni Sanger on 09/30/2019 and was started on this course of prednisone through virtual visit.  Symptoms started after playing golf or 4 days straight.  Also felt some muscle spasm and knots in right upper back and scapular area.  Flexeril helped slightly.  Massage also seem to help.  Numbness into the lateral hand and pinky finger. Pain is keeping him up from  sleeping. Hurts in back of arm down into lateral portoin of right arm. Denies significant pain into the neck and shoulder.   He notes weakness in right hand- and he is right handed. No recent fall but fell on elbow a year ago- hit garage floor. Did have some baseline pain around elbow and this has intensified.  A/P: Appears to have a right cervical radiculopathy with pain, numbness, weakness and positive Spurling test to the right with reproduction of all symptoms. -Patient has not made significant improvement with course of prednisone -We will refer to [facility]-patient specifically requests Dr. Jerene Bears will try to get in by the end of the week -Trial gabapentin before bed since nighttime seems to be the most difficult for him.-He is already trying Tylenol and Advil-discussed if not making improvement with gabapentin I would consider sending in tramadol for him -If he has new or worsening symptoms should let us know  Recommended follow up: Return for as needed for new, worsening, persistent symptoms. Future Appointments  Date Time Provider [address]  10/08/2019  1:00 PM [facility] [place]  10/09/2019  2:00 PM Bruning, Ashlyn, PA-C [facility] None    Lab/Order associations:   ICD-10-CM   1. Right cervical radiculopathy  M54.12 Ambulatory referral to Orthopedic Surgery    Meds ordered this encounter  Medications  . gabapentin (NEURONTIN) 100 MG capsule    Sig: 1-2 tablets at night for pain.    Dispense:  30 capsule    Refill:  0   Return precautions advised.  Garret Reddish, MD

## 2019-10-07 NOTE — Patient Instructions (Addendum)
We will call you within two weeks about your referral to Orthopedics . If you do not hear within 3 weeks, give [place] a call.   We would like for you to start gabapentin at night to see if it will help with pain. Take 1-2 tab before bed.  Start with one tab for two days and if does not help can increase to two.       Cervical Radiculopathy  Cervical radiculopathy means that a nerve in the neck (a cervical nerve) is pinched or bruised. This can happen because of an injury to the cervical spine (vertebrae) in the neck, or as a normal part of getting older. This can cause pain or loss of feeling (numbness) that runs from your neck all the way down to your arm and fingers. Often, this condition gets better with rest. Treatment may be needed if the condition does not get better. What are the causes?  A neck injury.  A bulging disk in your spine.  Muscle movements that you cannot control (muscle spasms).  Tight muscles in your neck due to overuse.  Arthritis.  Breakdown in the bones and joints of the spine (spondylosis) due to getting older.  Bone spurs that form near the nerves in the neck. What are the signs or symptoms?  Pain. The pain may: ? Run from the neck to the arm and hand. ? Be very bad or irritating. ? Be worse when you move your neck.  Loss of feeling or tingling in your arm or hand.  Weakness in your arm or hand, in very bad cases. How is this treated? In many cases, treatment is not needed for this condition. With rest, the condition often gets better over time. If treatment is needed, options may include:  Wearing a soft neck collar (cervical collar) for short periods of time, as told by your doctor.  Doing exercises (physical therapy) to strengthen your neck muscles.  Taking medicines.  Having shots (injections) in your spine, in very bad cases.  Having surgery. This may be needed if other treatments do not help. The type of surgery that is used depends on the  cause of your condition. Follow these instructions at home: If you have a soft neck collar:  Wear it as told by your doctor. Remove it only as told by your doctor.  Ask your doctor if you can remove the collar for cleaning and bathing. If you are allowed to remove the collar for cleaning or bathing: ? Follow instructions from your doctor about how to remove the collar safely. ? Clean the collar by wiping it with mild soap and water and drying it completely. ? Take out any removable pads in the collar every 1-2 days. Wash them by hand with soap and water. Let them air-dry completely before you put them back in the collar. ? Check your skin under the collar for redness or sores. If you see any, tell your doctor. Managing pain      Take over-the-counter and prescription medicines only as told by your doctor.  If told, put ice on the painful area. ? If you have a soft neck collar, remove it as told by your doctor. ? Put ice in a plastic bag. ? Place a towel between your skin and the bag. ? Leave the ice on for 20 minutes, 2-3 times a day.  If using ice does not help, you can try using heat. Use the heat source that your doctor recommends, such as  a moist heat pack or a heating pad. ? Place a towel between your skin and the heat source. ? Leave the heat on for 20-30 minutes. ? Remove the heat if your skin turns bright red. This is very important if you are unable to feel pain, heat, or cold. You may have a greater risk of getting burned.  You may try a gentle neck and shoulder rub (massage). Activity  Rest as needed.  Return to your normal activities as told by your doctor. Ask your doctor what activities are safe for you.  Do exercises as told by your doctor or physical therapist.  Do not lift anything that is heavier than 10 lb (4.5 kg) until your doctor tells you that it is safe. General instructions  Use a flat pillow when you sleep.  Do not drive while wearing a soft neck  collar. If you do not have a soft neck collar, ask your doctor if it is safe to drive while your neck heals.  Ask your doctor if the medicine prescribed to you requires you to avoid driving or using heavy machinery.  Do not use any products that contain nicotine or tobacco, such as cigarettes, e-cigarettes, and chewing tobacco. These can delay healing. If you need help quitting, ask your doctor.  Keep all follow-up visits as told by your doctor. This is important. Contact a doctor if:  Your condition does not get better with treatment. Get help right away if:  Your pain gets worse and is not helped with medicine.  You lose feeling or feel weak in your hand, arm, face, or leg.  You have a high fever.  You have a stiff neck.  You cannot control when you poop or pee (have incontinence).  You have trouble with walking, balance, or talking. Summary  Cervical radiculopathy means that a nerve in the neck is pinched or bruised.  A nerve can get pinched from a bulging disk, arthritis, an injury to the neck, or other causes.  Symptoms include pain, tingling, or loss of feeling that goes from the neck into the arm or hand.  Weakness in your arm or hand can happen in very bad cases.  Treatment may include resting, wearing a soft neck collar, and doing exercises. You might need to take medicines for pain. In very bad cases, shots or surgery may be needed. This information is not intended to replace advice given to you by your health care provider. Make sure you discuss any questions you have with your health care provider. Document Revised: 03/15/2018 Document Reviewed: 03/15/2018 Elsevier Patient Education  2020 [employer].

## 2019-10-08 ENCOUNTER — Ambulatory Visit (HOSPITAL_COMMUNITY)
Admission: RE | Admit: 2019-10-08 | Discharge: 2019-10-08 | Disposition: A | Discharge status: Home / Self Care | Payer: 59 | Source: Ambulatory Visit | Attending: Radiation Oncology | Admitting: Radiation Oncology

## 2019-10-08 ENCOUNTER — Encounter (HOSPITAL_COMMUNITY): Payer: Self-pay

## 2019-10-08 ENCOUNTER — Telehealth: Payer: Self-pay

## 2019-10-08 DIAGNOSIS — Z85828 Personal history of other malignant neoplasm of skin: Secondary | ICD-10-CM

## 2019-10-08 DIAGNOSIS — R911 Solitary pulmonary nodule: Secondary | ICD-10-CM | POA: Diagnosis not present

## 2019-10-08 NOTE — Telephone Encounter (Signed)
Appointment reminder for 10/09/19 patient verbalized he understood this is a telephone encounter.

## 2019-10-08 NOTE — Progress Notes (Signed)
Patient reports no vision or hearing changes. Fine motor skill issues as in grasping and picking up items has a doctor appointment today for nerve issues. Also reports numbness and tingling to arms and hands.

## 2019-10-08 NOTE — Progress Notes (Addendum)
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    Interval Since Last Radiation:  2 years  06/21/2017 - 08/01/2017:  Nose / 2Gy x 30 fractions  03/05/1995 to 04/04/1995: Nasal tip treated to 56.25 Gy in 25 fractions  Narrative:  I spoke with the patient to conduct his routine scheduled follow up visit via telephone to spare the patient unnecessary potential exposure in the healthcare setting during the current COVID-19 pandemic.  The patient was notified in advance and gave permission to proceed with this visit format. He had a CT Chest in 04/2019 for evaluation of persistent cough with his PCP.  This scan showed a nonspecific 5 mm subpleural nodule along the minor fissure. It was mentioned that a non-contrast chest CT could be considered in 12 months for patients considered to be high-risk. He does smoke cigars regularly and has remained highly concerned regarding the new nodule on CT so has requested repeat imaging sooner than initially recommended. He had a recent repeat CT Chest scan on 10/08/2019 and this visit is to review those results.   On review of systems, the patient states that he is doing very well overall.  He is currently without complaints.  He specifically denies chest pain, shortness of breath, productive cough or hemoptysis.  He has continued playing golf regularly.  He denies recent fevers, chills or night sweats.  He reports a healthy appetite and is maintaining his weight.  ALLERGIES:  has No Known Allergies.  Meds: Current Outpatient Medications  Medication Sig Dispense Refill  . aspirin EC 81 MG tablet Take 1 tablet (81 mg total) by mouth daily.    Marland Kitchen atorvastatin  (LIPITOR) 20 MG tablet Take 1 tablet (20 mg total) by mouth daily. 90 tablet 3  . cetirizine (ZYRTEC) 10 MG chewable tablet Chew 10 mg by mouth daily as needed for allergies.     . cyclobenzaprine (FLEXERIL) 10 MG tablet Take 0.5-1 tablets (5-10 mg total) by mouth at bedtime as needed for muscle spasms (do not drive for 8 hours after taking). 20 tablet 0  . diazepam (VALIUM) 5 MG tablet TAKE 1 TABLET AS NEEDED FOR FLYING. 30 tablet 0  . fish oil-omega-3 fatty acids 1000 MG capsule Take 2 g by mouth daily.      . fluticasone (FLONASE) 50 MCG/ACT nasal spray Place 2 sprays into both nostrils daily. 16 g 1  . gabapentin (NEURONTIN) 100 MG capsule 1-2 tablets at night for pain. 30 capsule 0  . ibuprofen (ADVIL,MOTRIN) 200 MG tablet Take 600-800 mg by mouth every 6 (six) hours as needed for moderate pain.    . Multiple Vitamin (MULTIVITAMIN) tablet Take 1 tablet by mouth daily.      . predniSONE (DELTASONE) 10 MG tablet Take 4 tabs qd x 2 days, 3 qd x 2 days, 2 qd x 2d, 1qd x 3 days 21 tablet 0  . tadalafil (ADCIRCA/CIALIS) 20 MG tablet TAKE 1 TABLET BY MOUTH DAILY IF NEEDED FOR ERECTILE DYSFUNCTION 15 tablet 2   No current facility-administered medications for this encounter.    Physical Findings:  vitals were not taken for this visit.   /Unable to assess due  to telephone follow up visit format.  Lab Findings: Lab Results  Component Value Date   WBC 6.2 07/10/2019   HGB 15.5 07/10/2019   HCT 46.3 07/10/2019   MCV 97.2 07/10/2019   PLT 242.0 07/10/2019     Radiographic Findings: No results found.  Impression/Plan: 59. 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   He has recovered well from the effects of radiotherapy.  We reviewed the results from his recent follow up CT Chest performed on 10/08/2019 and this shows an unchanged appearance of the 5 mm perifissural right middle lobe nodule, nonspecific and felt likely to be a  subpleural lymph node.  With his history of basal cell carcinoma and continued habit of smoking cigars, he prefers to continue to monitor this nodule closely, with a repeat CT chest in 6 months.  We will plan to contact him by phone to review those results shortly thereafter.  He knows to call with any questions or concerns in the interim.    Nicholos Johns, PA-C    Tyler Pita, MD  [facility] Direct Dial: [phone]  Fax: [phone] [email or web address]  Skype  LinkedIn

## 2019-10-09 ENCOUNTER — Encounter: Payer: Self-pay | Admitting: Urology

## 2019-10-09 ENCOUNTER — Other Ambulatory Visit: Payer: Self-pay

## 2019-10-09 ENCOUNTER — Ambulatory Visit
Admission: RE | Admit: 2019-10-09 | Discharge: 2019-10-09 | Disposition: A | Discharge status: Home / Self Care | Payer: 59 | Source: Ambulatory Visit | Attending: Urology | Admitting: Urology

## 2019-10-09 DIAGNOSIS — Z85828 Personal history of other malignant neoplasm of skin: Secondary | ICD-10-CM

## 2019-10-09 DIAGNOSIS — R918 Other nonspecific abnormal finding of lung field: Secondary | ICD-10-CM

## 2019-10-09 DIAGNOSIS — R911 Solitary pulmonary nodule: Secondary | ICD-10-CM | POA: Insufficient documentation

## 2019-10-20 IMAGING — DX CHEST - 2 VIEW
2 series · 2 of 2 positions shown · non-contrast
Comparison: January 23, 2018

CLINICAL DATA: Chronic cough

EXAM:
CHEST - 2 VIEW

[chest pa]
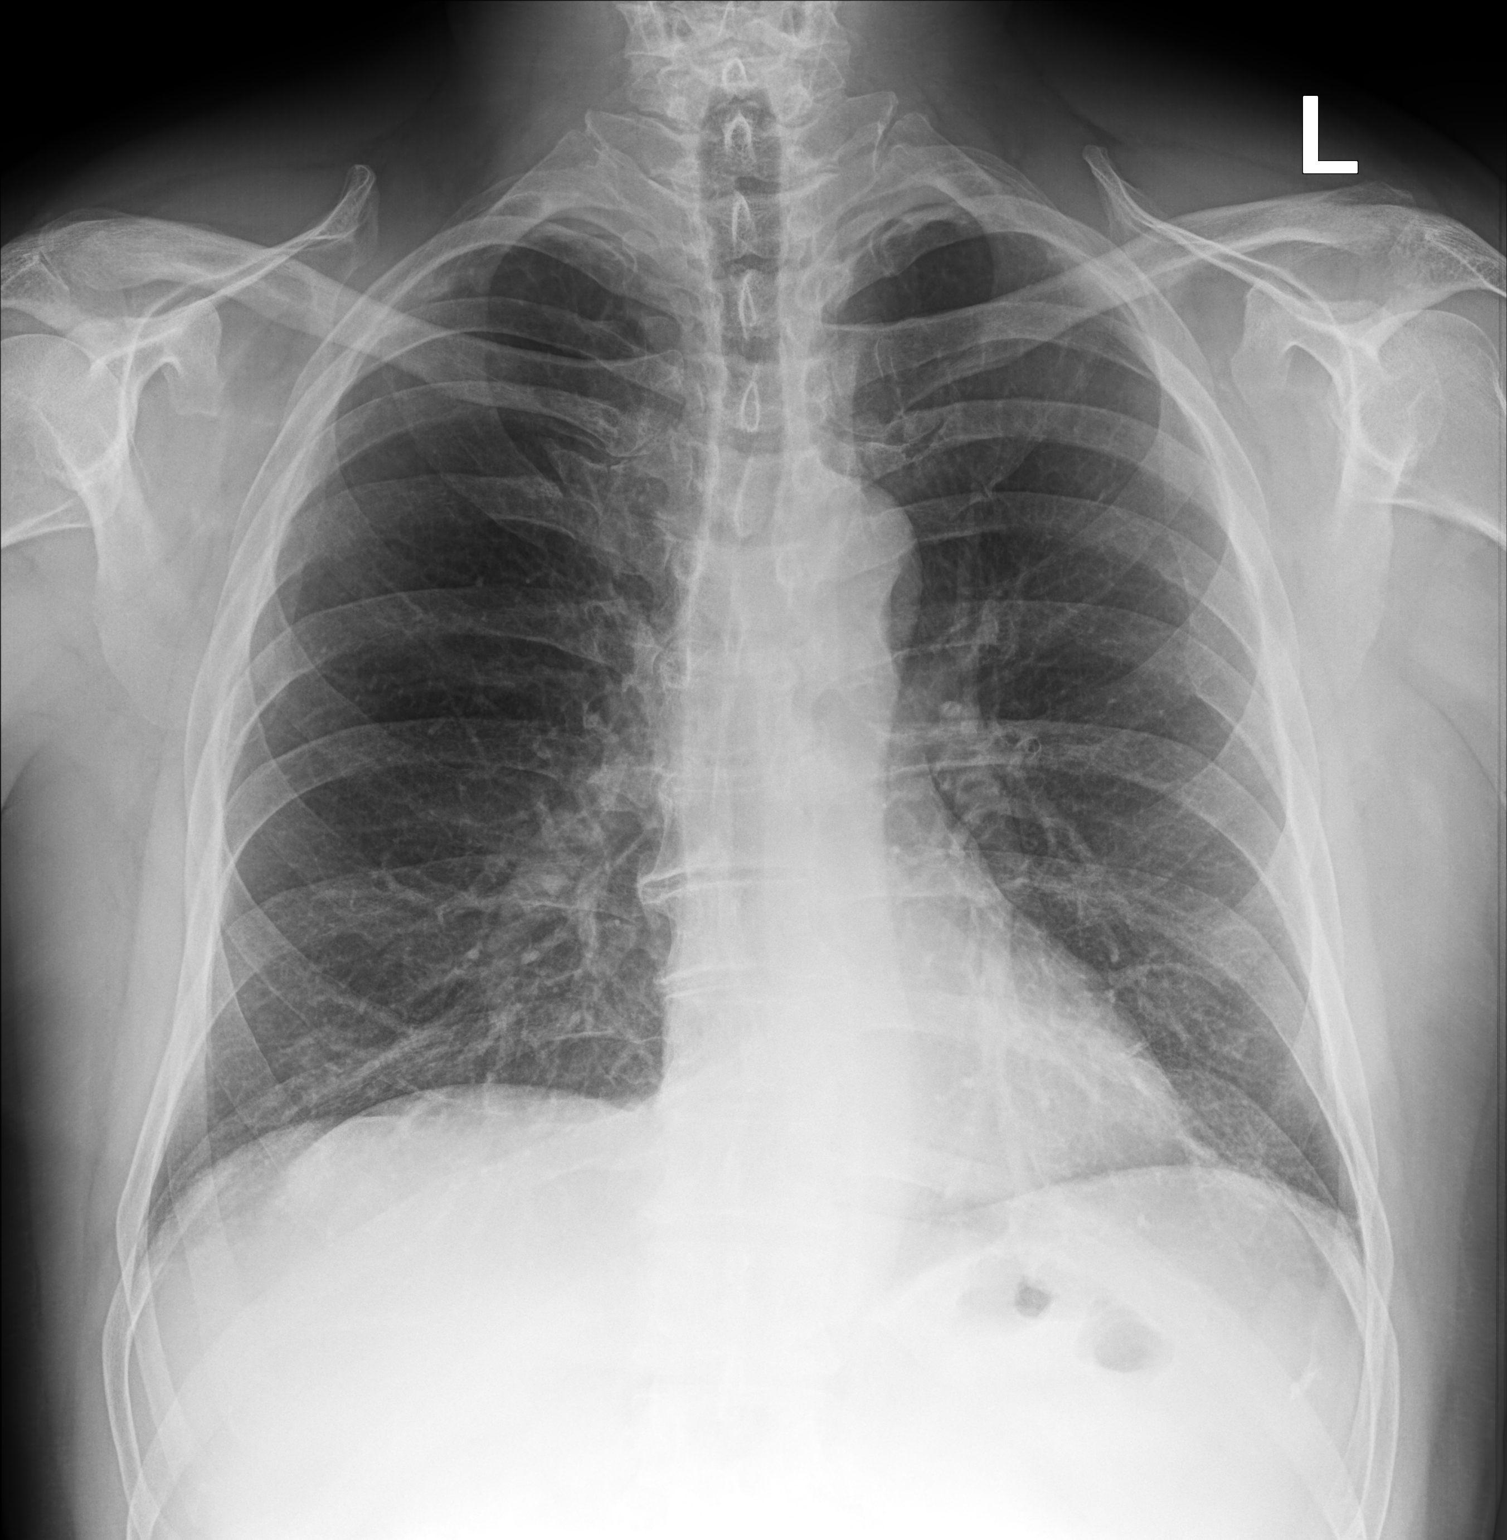

[chest lat]
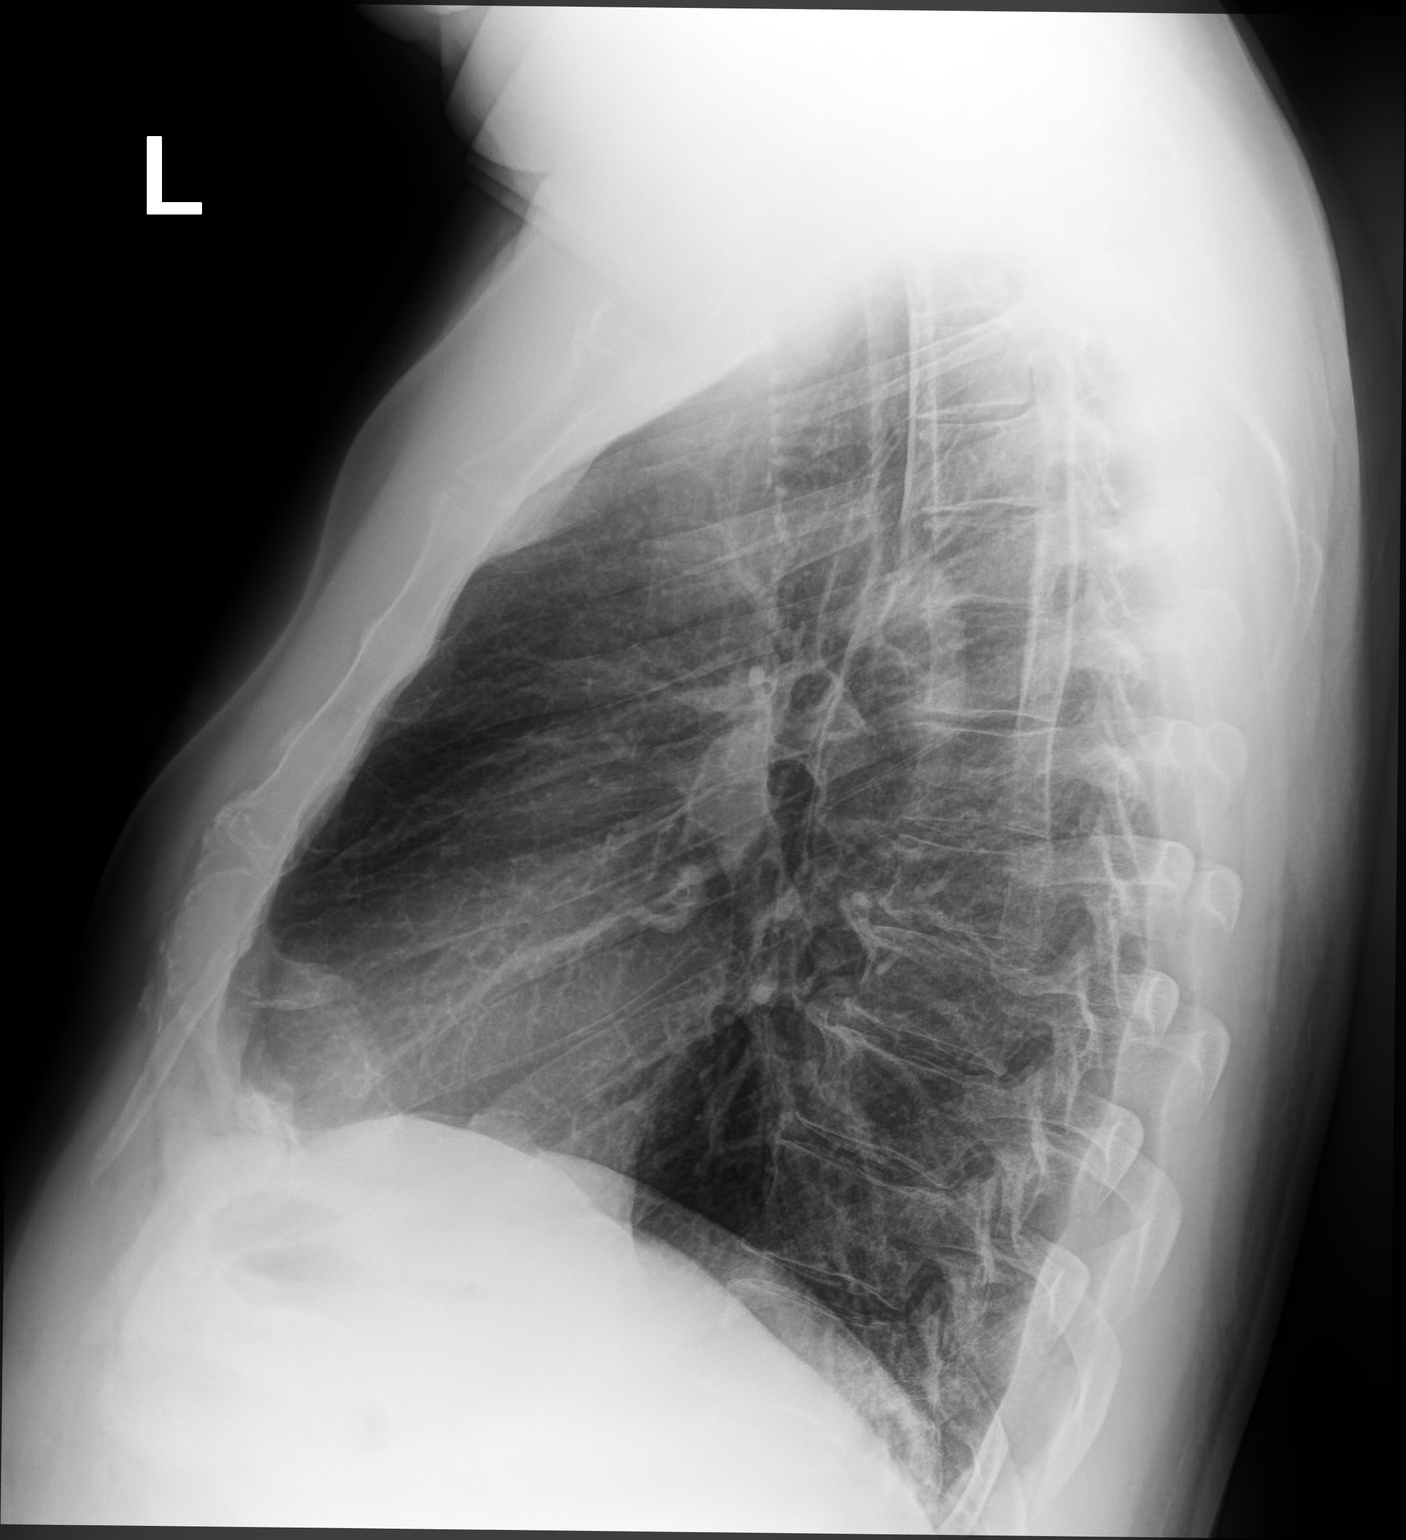

[2 of 2 positions shown; findings below may reference images not displayed]

FINDINGS: No edema or consolidation. Heart size and pulmonary vascularity are
normal. No adenopathy. No bone lesions.
IMPRESSION: No edema or consolidation.

## 2019-10-27 HISTORY — PX: OTHER SURGICAL HISTORY: SHX169

## 2019-12-03 DIAGNOSIS — F4320 Adjustment disorder, unspecified: Secondary | ICD-10-CM | POA: Diagnosis not present

## 2019-12-04 DIAGNOSIS — M4802 Spinal stenosis, cervical region: Secondary | ICD-10-CM | POA: Diagnosis not present

## 2020-01-14 DIAGNOSIS — F4320 Adjustment disorder, unspecified: Secondary | ICD-10-CM | POA: Diagnosis not present

## 2020-02-26 DIAGNOSIS — F4320 Adjustment disorder, unspecified: Secondary | ICD-10-CM | POA: Diagnosis not present

## 2020-03-04 DIAGNOSIS — G959 Disease of spinal cord, unspecified: Secondary | ICD-10-CM | POA: Diagnosis not present

## 2020-03-04 DIAGNOSIS — M4802 Spinal stenosis, cervical region: Secondary | ICD-10-CM | POA: Diagnosis not present

## 2020-03-25 DIAGNOSIS — F4320 Adjustment disorder, unspecified: Secondary | ICD-10-CM | POA: Diagnosis not present

## 2020-04-05 ENCOUNTER — Telehealth: Payer: Self-pay

## 2020-04-05 DIAGNOSIS — H43393 Other vitreous opacities, bilateral: Secondary | ICD-10-CM | POA: Diagnosis not present

## 2020-04-05 DIAGNOSIS — H539 Unspecified visual disturbance: Secondary | ICD-10-CM

## 2020-04-05 DIAGNOSIS — H33301 Unspecified retinal break, right eye: Secondary | ICD-10-CM | POA: Diagnosis not present

## 2020-04-05 DIAGNOSIS — H43399 Other vitreous opacities, unspecified eye: Secondary | ICD-10-CM

## 2020-04-05 NOTE — Telephone Encounter (Signed)
ATC the patient to make him aware that going to the ED to be seen for eye is his best option I was unable to get him scheduled at [facility]-I left patient a VM requesting a call back if he needs further instruction

## 2020-04-05 NOTE — Telephone Encounter (Signed)
Called and spoke with pt and pt aware of Dr. Yong Channel message. Urgent referral has been placed and pt aware also.

## 2020-04-05 NOTE — Telephone Encounter (Signed)
Urgent optho referral- if cant be seen today hopefully within next few hours needs ER

## 2020-04-05 NOTE — Addendum Note (Signed)
Addended by: Clyde Lundborg A on: 04/05/2020 09:31 AM   Modules accepted: Orders

## 2020-04-05 NOTE — Telephone Encounter (Signed)
Pt is seeing black spots and web that floats in his vision. It is not going away. Pt is seeking advice.

## 2020-04-06 DIAGNOSIS — H3562 Retinal hemorrhage, left eye: Secondary | ICD-10-CM | POA: Diagnosis not present

## 2020-04-06 DIAGNOSIS — H43821 Vitreomacular adhesion, right eye: Secondary | ICD-10-CM | POA: Diagnosis not present

## 2020-04-06 DIAGNOSIS — H4312 Vitreous hemorrhage, left eye: Secondary | ICD-10-CM | POA: Diagnosis not present

## 2020-04-20 DIAGNOSIS — H43812 Vitreous degeneration, left eye: Secondary | ICD-10-CM | POA: Diagnosis not present

## 2020-04-20 DIAGNOSIS — H43821 Vitreomacular adhesion, right eye: Secondary | ICD-10-CM | POA: Diagnosis not present

## 2020-04-20 DIAGNOSIS — H35372 Puckering of macula, left eye: Secondary | ICD-10-CM | POA: Diagnosis not present

## 2020-04-20 DIAGNOSIS — H4312 Vitreous hemorrhage, left eye: Secondary | ICD-10-CM | POA: Diagnosis not present

## 2020-04-21 DIAGNOSIS — L570 Actinic keratosis: Secondary | ICD-10-CM | POA: Diagnosis not present

## 2020-04-21 DIAGNOSIS — L821 Other seborrheic keratosis: Secondary | ICD-10-CM | POA: Diagnosis not present

## 2020-04-21 DIAGNOSIS — D1801 Hemangioma of skin and subcutaneous tissue: Secondary | ICD-10-CM | POA: Diagnosis not present

## 2020-04-21 DIAGNOSIS — L111 Transient acantholytic dermatosis [Grover]: Secondary | ICD-10-CM | POA: Diagnosis not present

## 2020-04-21 DIAGNOSIS — C44712 Basal cell carcinoma of skin of right lower limb, including hip: Secondary | ICD-10-CM | POA: Diagnosis not present

## 2020-04-21 DIAGNOSIS — Z85828 Personal history of other malignant neoplasm of skin: Secondary | ICD-10-CM | POA: Diagnosis not present

## 2020-04-22 ENCOUNTER — Telehealth: Payer: Self-pay | Admitting: *Deleted

## 2020-04-22 DIAGNOSIS — F4320 Adjustment disorder, unspecified: Secondary | ICD-10-CM | POA: Diagnosis not present

## 2020-04-22 NOTE — Telephone Encounter (Signed)
Patient (He is not having his CT scan until 05/26/20. He would like to come in for the results

## 2020-05-12 ENCOUNTER — Ambulatory Visit: Payer: Self-pay | Admitting: Urology

## 2020-05-26 ENCOUNTER — Encounter (HOSPITAL_COMMUNITY): Payer: Self-pay

## 2020-05-26 ENCOUNTER — Ambulatory Visit (HOSPITAL_COMMUNITY)
Admission: RE | Admit: 2020-05-26 | Discharge: 2020-05-26 | Disposition: A | Discharge status: Home / Self Care | Payer: BC Managed Care – PPO | Source: Ambulatory Visit | Attending: Urology | Admitting: Urology

## 2020-05-26 ENCOUNTER — Other Ambulatory Visit: Payer: Self-pay

## 2020-05-26 DIAGNOSIS — R918 Other nonspecific abnormal finding of lung field: Secondary | ICD-10-CM | POA: Insufficient documentation

## 2020-05-26 DIAGNOSIS — J189 Pneumonia, unspecified organism: Secondary | ICD-10-CM | POA: Diagnosis not present

## 2020-05-26 DIAGNOSIS — R911 Solitary pulmonary nodule: Secondary | ICD-10-CM | POA: Diagnosis not present

## 2020-05-27 ENCOUNTER — Ambulatory Visit
Admission: RE | Admit: 2020-05-27 | Discharge: 2020-05-27 | Disposition: A | Discharge status: Home / Self Care | Payer: BC Managed Care – PPO | Source: Ambulatory Visit | Attending: Urology | Admitting: Urology

## 2020-05-27 ENCOUNTER — Telehealth: Payer: Self-pay | Admitting: Pulmonary Disease

## 2020-05-27 ENCOUNTER — Encounter: Payer: Self-pay | Admitting: Urology

## 2020-05-27 DIAGNOSIS — Z85828 Personal history of other malignant neoplasm of skin: Secondary | ICD-10-CM | POA: Diagnosis not present

## 2020-05-27 DIAGNOSIS — R911 Solitary pulmonary nodule: Secondary | ICD-10-CM

## 2020-05-27 DIAGNOSIS — Z08 Encounter for follow-up examination after completed treatment for malignant neoplasm: Secondary | ICD-10-CM | POA: Diagnosis not present

## 2020-05-27 DIAGNOSIS — R918 Other nonspecific abnormal finding of lung field: Secondary | ICD-10-CM

## 2020-05-27 NOTE — Progress Notes (Signed)
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    Interval Since Last Radiation:  2 years, 10 months 06/21/2017 - 08/01/2017:  Nose / 2Gy x 30 fractions  03/05/1995 to 04/04/1995: Nasal tip treated to 56.25 Gy in 25 fractions  Narrative:  I spoke with the patient to conduct his routine scheduled follow up visit via telephone to spare the patient unnecessary potential exposure in the healthcare setting during the current COVID-19 pandemic.  The patient was notified in advance and gave permission to proceed with this visit format. He has recovered well from the effects of his recent radiotherapy to the basal cell carcinoma of the nose completed in March of 2019.  He continues in routine follow-up with his dermatologist, Wilhemina Bonito, MD and had a recent visit with him in December 2021.    He had a CT Chest in 04/2019 with his PCP for evaluation of a persistent cough.  This scan showed a nonspecific 5 mm subpleural nodule along the minor fissure RML. It was mentioned that a non-contrast chest CT could be considered in 12 months for patients considered to be high-risk. He does smoke cigars regularly and has remained highly concerned regarding the new nodule on CT and therefore requested repeat imaging sooner than initially recommended. He had a repeat CT Chest scan on 10/08/2019 showing the 5 mm perifissural right middle lobe nodule to appear stable and compatible with a subpleural lymph node without any suspicious pulmonary nodules or masses.  He has continued in observation and had a repeat CT chest on 05/26/2020. Today's visit is to review those results.  Fortunately the scan appears stable as well,  although there is a cluster of 1 mm nodules in the left lung apex in a pattern consistent with pulmonary infection.  There are no concerning pulmonary nodules or lymphadenopathy.   On review of systems, the patient states that he is doing very well overall.  He is currently without complaints aside from a persistent, nagging cough that has been present since he returned from a visit with his grandkids around Thanksgiving.  He specifically denies chest pain, shortness of breath, productive cough or hemoptysis.  He has continued playing golf regularly.  He denies recent fevers, chills or night sweats.  He reports a healthy appetite and is maintaining his weight.  In general, he feels well.  ALLERGIES:  has No Known Allergies.  Meds: Current Outpatient Medications  Medication Sig Dispense Refill  . aspirin EC 81 MG tablet Take 1 tablet (81 mg total) by mouth daily.    Marland Kitchen atorvastatin (LIPITOR) 20 MG tablet Take 1 tablet (20 mg total) by mouth daily. 90 tablet 3  . cetirizine (ZYRTEC) 10 MG chewable tablet Chew 10 mg by mouth daily as needed for allergies.    . cyclobenzaprine (FLEXERIL) 10 MG tablet Take 0.5-1 tablets (5-10 mg total) by mouth at bedtime as needed for muscle spasms (do not drive for 8 hours after taking). 20 tablet 0  . diazepam (VALIUM) 5 MG tablet TAKE 1 TABLET AS NEEDED FOR FLYING. 30 tablet 0  . fish oil-omega-3 fatty acids 1000 MG capsule Take 2 g by mouth daily.    Marland Kitchen  fluticasone (FLONASE) 50 MCG/ACT nasal spray Place 2 sprays into both nostrils daily. 16 g 1  . gabapentin (NEURONTIN) 100 MG capsule 1-2 tablets at night for pain. 30 capsule 0  . ibuprofen (ADVIL,MOTRIN) 200 MG tablet Take 600-800 mg by mouth every 6 (six) hours as needed for moderate pain.    . Multiple Vitamin (MULTIVITAMIN) tablet Take 1 tablet by mouth daily.    . predniSONE (DELTASONE) 10 MG tablet Take 4 tabs qd x 2 days, 3 qd x 2 days, 2 qd x 2d, 1qd x 3 days 21 tablet 0  . tadalafil (ADCIRCA/CIALIS) 20 MG  tablet TAKE 1 TABLET BY MOUTH DAILY IF NEEDED FOR ERECTILE DYSFUNCTION 15 tablet 2   No current facility-administered medications for this encounter.    Physical Findings:  vitals were not taken for this visit.   /Unable to assess due to telephone follow up visit format.  Lab Findings: Lab Results  Component Value Date   WBC 6.2 07/10/2019   HGB 15.5 07/10/2019   HCT 46.3 07/10/2019   MCV 97.2 07/10/2019   PLT 242.0 07/10/2019     Radiographic Findings: CT Chest Wo Contrast  Result Date: 05/26/2020 CLINICAL DATA:  Follow-up pulmonary nodule.  Smoking history EXAM: CT CHEST WITHOUT CONTRAST TECHNIQUE: Multidetector CT imaging of the chest was performed following the standard protocol without IV contrast. COMPARISON:  CT 10/08/2019 FINDINGS: Cardiovascular: No significant vascular findings. Normal heart size. No pericardial effusion. Mediastinum/Nodes: No axillary or supraclavicular adenopathy. No mediastinal or hilar adenopathy. No pericardial fluid. Esophagus normal. Lungs/Pleura: 6 mm nodule in the RIGHT middle lobe is unchanged from prior. Nodule also present CT 04/10/2019. New cluster of nodules in the LEFT lung apex (image 89/coronal series 5). These nodules are 1 mm and and clustered in a pulmonary infection pattern Upper Abdomen: Limited view of the liver, kidneys, pancreas are unremarkable. Normal adrenal glands. Musculoskeletal: No aggressive osseous lesion. IMPRESSION: 1. Stable benign nodule in the RIGHT middle lobe. 2. A cluster of nodules in the LEFT lung apex present a pulmonary infection pattern. 3. In patient with smoking history, consider annual low-dose CT lung cancer screening Electronically Signed   By: Suzy Bouchard M.D.   On: 05/26/2020 10:39    Impression/Plan: 15. 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   He has recovered well from the effects of radiotherapy.  We reviewed the results from his  recent follow up CT Chest performed on 05/26/20 and this shows an unchanged appearance of the perifissural right middle lobe nodule, nonspecific and felt likely to be a subpleural lymph node.  The cluster of 1 mm nodules in the left lung apex that appear to be in a pattern consistent with pulmonary infection seem to be consistent with his symptoms.  If the cough persists, he will have this evaluated with his PCP.  With his history of basal cell carcinoma and continued habit of smoking cigars, he prefers to continue to monitor the RML nodule closely, with a repeat CT chest in 6 months.  We will plan to contact him by phone to review those results shortly thereafter.  He knows to call with any questions or concerns in the interim.    Nicholos Johns, PA-C    Tyler Pita, MD  [facility] Direct Dial: [phone]  Fax: [phone] [email or web address]  Skype  LinkedIn

## 2020-05-27 NOTE — Telephone Encounter (Signed)
[facility]:  We were contacted by the [place] Incidental Findings Program.  Recommendations: Referral to lung cancer screening program Orders placed PCP notified.   J[facility] 05/27/2020 1:32 PM

## 2020-05-27 NOTE — Telephone Encounter (Signed)
-----   Message from Annie Paras sent at 05/27/2020  1:13 PM EST ----- Regarding: Radiology Follow Up In patient with smoking history, consider annual low-dose CT lung cancer screening.

## 2020-05-28 NOTE — Telephone Encounter (Signed)
Thanks so much. 

## 2020-05-31 ENCOUNTER — Telehealth: Payer: Self-pay

## 2020-05-31 NOTE — Telephone Encounter (Signed)
See below

## 2020-05-31 NOTE — Telephone Encounter (Signed)
Pt returned called and below message was given.

## 2020-05-31 NOTE — Telephone Encounter (Signed)
6 mm is very small and very close to 5 mm- this could be slight different size simply due to measurer themselves such as if 2 different people were measuring how wide a penny was- no evidence of significant increase in size.   Dr. Leonia Reeves looked at prior imaging already for comparison to current CT and thought things were stable other than left upper lung with possible infectious pattern which we can discuss at visit.

## 2020-05-31 NOTE — Telephone Encounter (Signed)
Patient called in he is schedule for 06/04/20 discuss his CT. Patient wants to see if Dr. Yong Channel can talk to Dr. Leonia Reeves and confirms the nodule is 59mm. Patient also wants his last 2 CT to be looked at also to make sure nothing has been missed.

## 2020-05-31 NOTE — Telephone Encounter (Signed)
Reached out to pt and phone went straight to vm, lm for pt tcb.

## 2020-05-31 NOTE — Telephone Encounter (Signed)
Called and lm for pt tcb. 

## 2020-06-04 ENCOUNTER — Telehealth (INDEPENDENT_AMBULATORY_CARE_PROVIDER_SITE_OTHER): Payer: BC Managed Care – PPO | Admitting: Family Medicine

## 2020-06-04 ENCOUNTER — Encounter: Payer: Self-pay | Admitting: Family Medicine

## 2020-06-04 DIAGNOSIS — R911 Solitary pulmonary nodule: Secondary | ICD-10-CM | POA: Diagnosis not present

## 2020-06-04 DIAGNOSIS — J189 Pneumonia, unspecified organism: Secondary | ICD-10-CM

## 2020-06-04 MED ORDER — DOXYCYCLINE HYCLATE 100 MG PO TABS: 100.0000 mg | ORAL_TABLET | Freq: Two times a day (BID) | ORAL | 0 refills | Status: AC

## 2020-06-04 NOTE — Progress Notes (Signed)
Phone [phone] Virtual visit via Video note   Subjective:  Chief complaint: Chief Complaint  Patient presents with  . Follow-up    Imaging questions   This visit type was conducted due to national recommendations for restrictions regarding the COVID-19 Pandemic (e.g. social distancing).  This format is felt to be most appropriate for this patient at this time balancing risks to patient and risks to population by having him in for in person visit.  No physical exam was performed (except for noted visual exam or audio findings with Telehealth visits).    Our team/I connected with [name] at  2:20 PM EST by a video enabled telemedicine application ([email or web address] or caregility through epic) and verified that I am speaking with the correct person using two identifiers.  Location patient: [facility] Location provider: [facility], office Persons participating in the virtual visit:  patient  Our team/I discussed the limitations of evaluation and management by telemedicine and the availability of in person appointments. In light of current covid-19 pandemic, patient also understands that we are trying to protect them by minimizing in office contact if at all possible.  The patient expressed consent for telemedicine visit and agreed to proceed. Patient understands insurance will be billed.   Past Medical History-  Patient Active Problem List   Diagnosis Date Noted  . Tobacco use disorder 02/23/2009    Priority: High  . Benign paroxysmal positional vertigo 07/10/2019    Priority: Medium  . Anxiety state 01/03/2016    Priority: Medium  . Palpitations 06/05/2013    Priority: Medium  . Hyperlipidemia, mild 12/10/2007    Priority: Medium  . Benign essential tremor 12/10/2007    Priority: Medium  . Reactive airway disease 08/03/2015    Priority: Low  . Erectile dysfunction 03/05/2014    Priority: Low  . Microscopic hematuria 12/10/2007    Priority: Low  . Allergic rhinitis 01/04/2007     Priority: Low  . Incidental lung nodule, > 4mm and < 86mm 10/09/2019  . 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 doxycycline (VIBRA-TABS) 100 MG tablet Take 1 tablet (100 mg total) by mouth 2 (two) times daily for 7 days. 14 tablet 0  . aspirin EC 81 MG tablet Take 1 tablet (81 mg total) by mouth daily.    Marland Kitchen atorvastatin (LIPITOR) 20 MG tablet Take 1 tablet (20 mg total) by mouth daily. 90 tablet 3  . cetirizine (ZYRTEC) 10 MG chewable tablet Chew 10 mg by mouth daily as needed for allergies.    . cyclobenzaprine (FLEXERIL) 10 MG tablet Take 0.5-1 tablets (5-10 mg total) by mouth at bedtime as needed for muscle spasms (do not drive for 8 hours after taking). 20 tablet 0  . diazepam (VALIUM) 5 MG tablet TAKE 1 TABLET AS NEEDED FOR FLYING. 30 tablet 0  . fish oil-omega-3 fatty acids 1000 MG capsule Take 2 g by mouth daily.    . fluticasone (FLONASE) 50 MCG/ACT nasal spray Place 2 sprays into both nostrils daily. 16 g 1  . gabapentin (NEURONTIN) 100 MG capsule 1-2 tablets at night for pain. 30 capsule 0  . ibuprofen (ADVIL,MOTRIN) 200 MG tablet Take 600-800 mg by mouth every 6 (six) hours as needed for moderate pain.    . Multiple Vitamin (MULTIVITAMIN) tablet Take 1 tablet by mouth daily.    . tadalafil (ADCIRCA/CIALIS) 20 MG tablet TAKE 1 TABLET BY  MOUTH DAILY IF NEEDED FOR ERECTILE DYSFUNCTION 15 tablet 2   No current facility-administered medications for this visit.     Objective:  no self reported vitals Gen: NAD, resting comfortably     Assessment and Plan   # Cough/imaging findings/concern pneumonia S:intermittent slight cough. No shortness of breath. No fever. About  3-4 weeks of symptoms. Not a major concern for him. Some phlegm/mucus- worse in mornings.   On ct "2. A cluster of nodules in the LEFT lung apex present a pulmonary infection pattern." A/P: patient  with several weeks of cough/congestion. Not severe but with CT imaging showing infection patient would like to treat- we opted to treat with doxycycline- if this fails to improve symptoms consider combination augmentin and azithromycin   # Lung nodule follow up S: from ct on 05/26/20- largely stable from 04/10/2019 and 10/08/19 "1. Stable benign nodule in the RIGHT middle lobe."  A/P: patient wants to be cautious and check every 6 months potentially. I told him my concern with this is radiation exposure and potential affects overtime- he will take this into consideration with next request- im ok with 6 months   Recommended follow up: needs to at least be seen annually for CPE- otherwise also to be seen if fails to improve from current symptoms Future Appointments  Date Time Provider [place]  11/25/2020  1:00 PM Bruning, Ashlyn, PA-C [facility] None    Lab/Order associations:   ICD-10-CM   1. Pulmonary infection  J18.9   2. Lung nodule  R91.1     Meds ordered this encounter  Medications  . doxycycline (VIBRA-TABS) 100 MG tablet    Sig: Take 1 tablet (100 mg total) by mouth 2 (two) times daily for 7 days.    Dispense:  14 tablet    Refill:  0    Return precautions advised.  Garret Reddish, MD

## 2020-06-24 DIAGNOSIS — F4320 Adjustment disorder, unspecified: Secondary | ICD-10-CM | POA: Diagnosis not present

## 2020-07-07 ENCOUNTER — Other Ambulatory Visit: Payer: Self-pay | Admitting: Family Medicine

## 2020-07-07 DIAGNOSIS — F4323 Adjustment disorder with mixed anxiety and depressed mood: Secondary | ICD-10-CM

## 2020-08-24 DIAGNOSIS — S83241A Other tear of medial meniscus, current injury, right knee, initial encounter: Secondary | ICD-10-CM | POA: Diagnosis not present

## 2020-09-09 ENCOUNTER — Other Ambulatory Visit: Payer: Self-pay | Admitting: Family Medicine

## 2020-09-28 DIAGNOSIS — S83241D Other tear of medial meniscus, current injury, right knee, subsequent encounter: Secondary | ICD-10-CM | POA: Diagnosis not present

## 2020-09-30 DIAGNOSIS — Z23 Encounter for immunization: Secondary | ICD-10-CM | POA: Diagnosis not present

## 2020-10-21 ENCOUNTER — Telehealth: Payer: Self-pay | Admitting: *Deleted

## 2020-10-21 NOTE — Telephone Encounter (Signed)
RETURNED PATIENT'S PHONE CALL, SPOKE WITH PATIENT. ?

## 2020-10-28 ENCOUNTER — Other Ambulatory Visit: Payer: Self-pay

## 2020-10-28 ENCOUNTER — Encounter: Payer: Self-pay | Admitting: Family Medicine

## 2020-11-25 ENCOUNTER — Ambulatory Visit: Payer: Self-pay | Admitting: Urology

## 2020-11-29 ENCOUNTER — Other Ambulatory Visit: Payer: Self-pay

## 2020-11-29 ENCOUNTER — Ambulatory Visit (HOSPITAL_COMMUNITY)
Admission: RE | Admit: 2020-11-29 | Discharge: 2020-11-29 | Disposition: A | Discharge status: Home / Self Care | Payer: BC Managed Care – PPO | Source: Ambulatory Visit | Attending: Urology | Admitting: Urology

## 2020-11-29 DIAGNOSIS — R911 Solitary pulmonary nodule: Secondary | ICD-10-CM | POA: Diagnosis not present

## 2020-11-29 DIAGNOSIS — R918 Other nonspecific abnormal finding of lung field: Secondary | ICD-10-CM | POA: Insufficient documentation

## 2020-11-29 DIAGNOSIS — I700 Atherosclerosis of aorta: Secondary | ICD-10-CM | POA: Diagnosis not present

## 2020-12-03 ENCOUNTER — Encounter: Payer: Self-pay | Admitting: Urology

## 2020-12-03 ENCOUNTER — Other Ambulatory Visit: Payer: Self-pay | Admitting: Urology

## 2020-12-03 DIAGNOSIS — R918 Other nonspecific abnormal finding of lung field: Secondary | ICD-10-CM

## 2020-12-03 NOTE — Progress Notes (Signed)
I spoke with the patient by telephone today to review the results from his recent CT chest that was performed on 11/29/2020.  This revealed a continued stable appearance of the 5 mm perifissural right middle lobe pulmonary nodule that we have been following since December 2020.  He remains asymptomatic and is currently without complaints.  We discussed the plan to repeat a CT chest scan in 1 year and I will again follow-up with him by telephone as soon as I have those results available.  He is comfortable and in agreement with the stated plan.  He knows that he is welcome to call at anytime in the interim with any questions or concerns.  Nicholos Johns, MMS, PA-C [facility]: [phone]  Fax: [phone]

## 2020-12-23 ENCOUNTER — Ambulatory Visit: Payer: BC Managed Care – PPO | Admitting: Urology

## 2021-01-19 ENCOUNTER — Telehealth: Payer: Self-pay | Admitting: Genetic Counselor

## 2021-01-19 NOTE — Telephone Encounter (Signed)
Requested genetic counseling appointment.  Scheduled 9/27 at 10am with Mel Almond for [place] Video Visit.

## 2021-02-01 ENCOUNTER — Telehealth: Payer: BC Managed Care – PPO | Admitting: Genetic Counselor

## 2021-05-02 IMAGING — CT CT CHEST W/O CM
2 of 4 series · 15 of 36 positions shown, 18 images · non-contrast
Comparison: CT 10/08/2019

CLINICAL DATA: Follow-up pulmonary nodule.  Smoking history

EXAM:
CT CHEST WITHOUT CONTRAST
TECHNIQUE: Multidetector CT imaging of the chest was performed following the
standard protocol without IV contrast.

[Series 2: thorax · axial · 0.78mm/px · z∈[-333,-19]mm · 12 of 185 slices shown, 15 images]
[im 14/185  mediastinal]
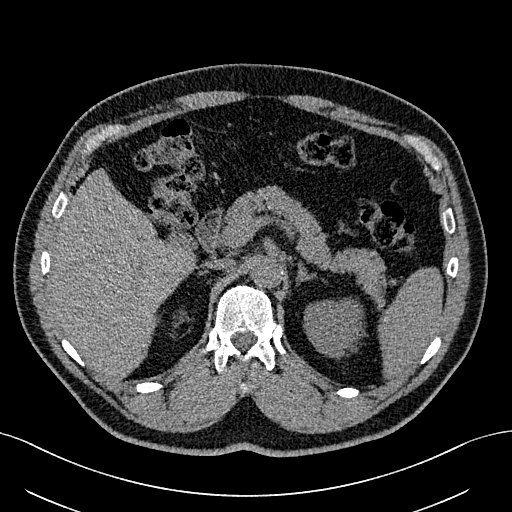
[im 14/185  lung]
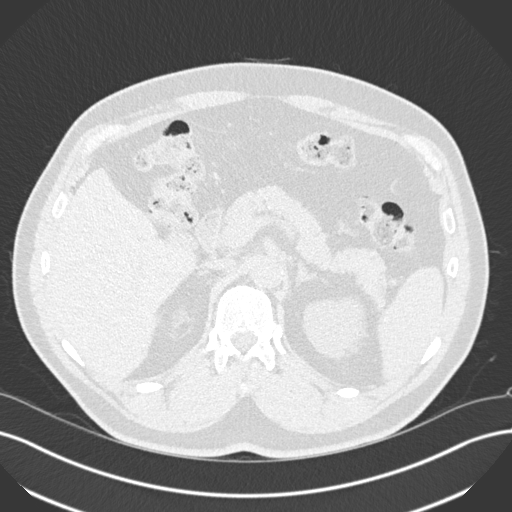
[im 27/185  lung]
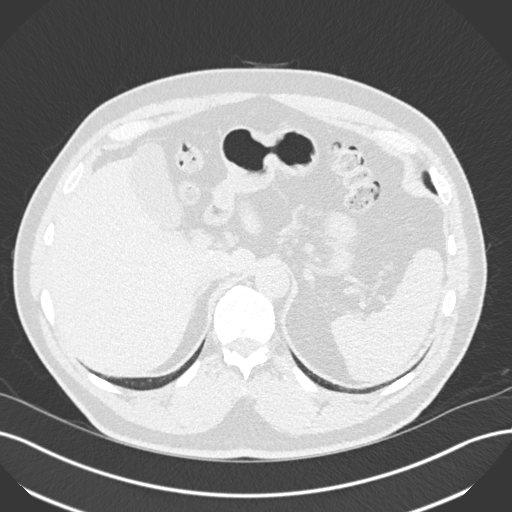
[im 40/185  lung]
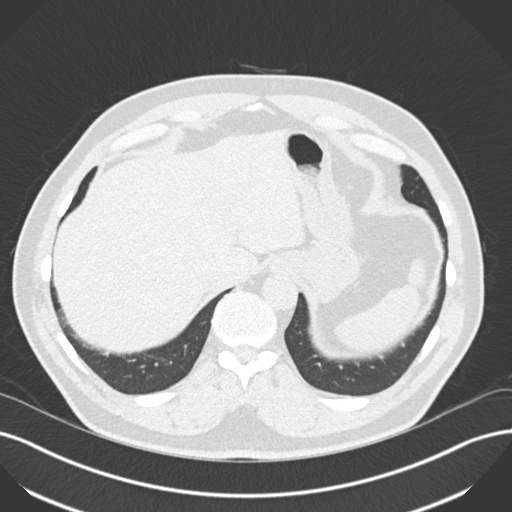
[im 53/185  lung]
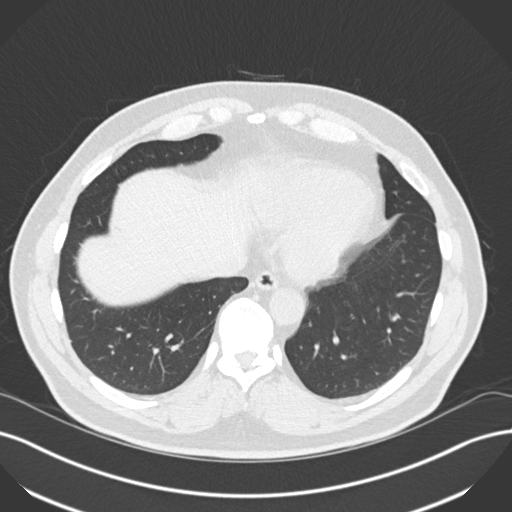
[im 66/185  mediastinal]
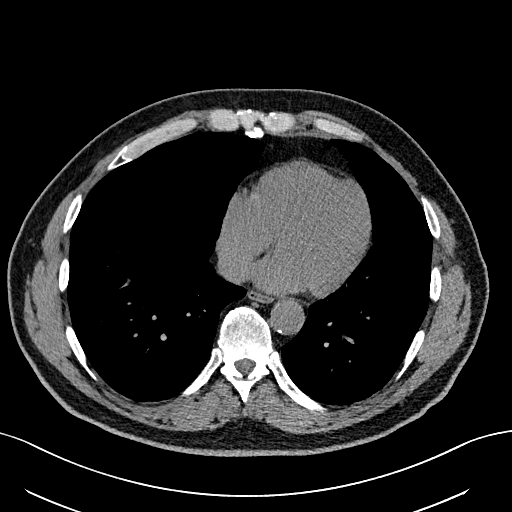
[im 66/185  lung]
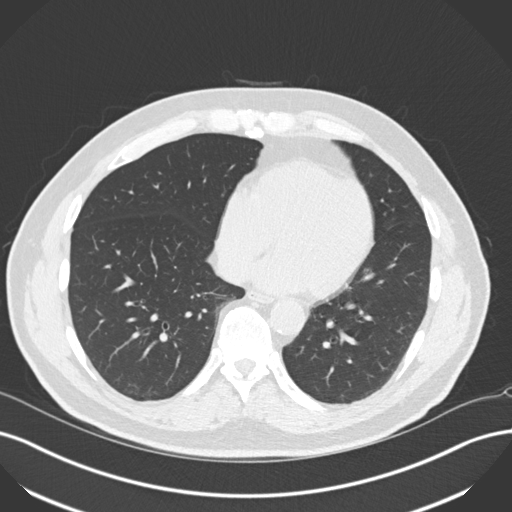
[im 79/185  lung]
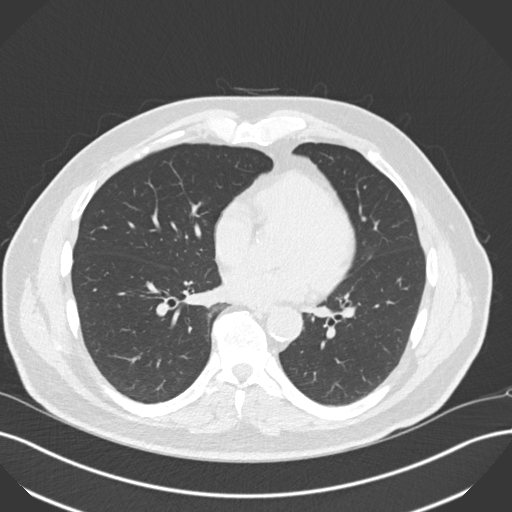
[im 106/185  lung]
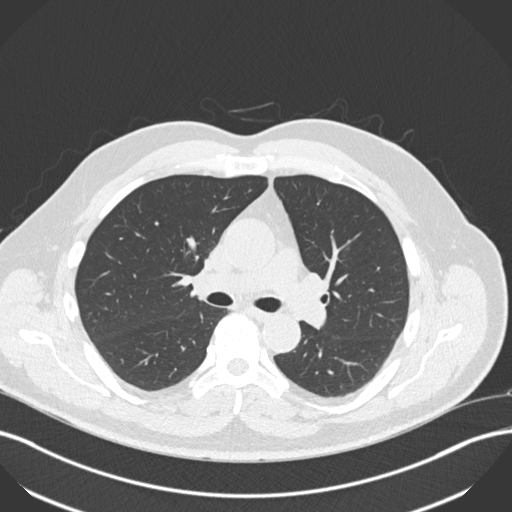
[im 119/185  lung]
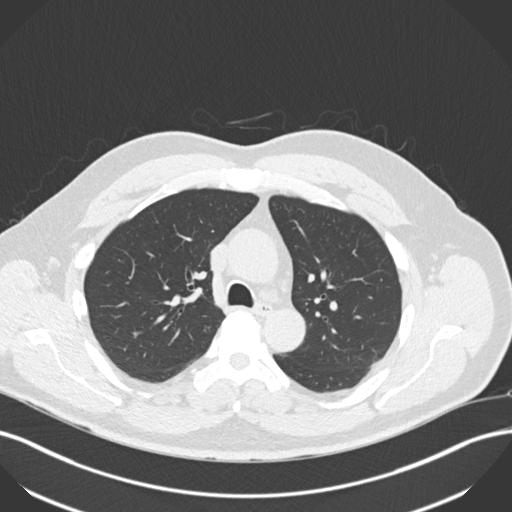
[im 132/185  mediastinal]
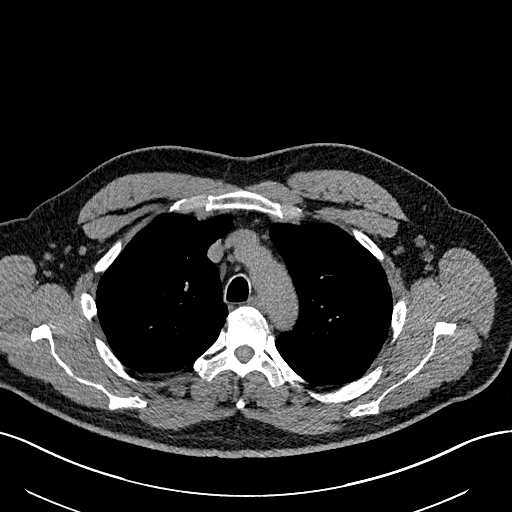
[im 132/185  lung]
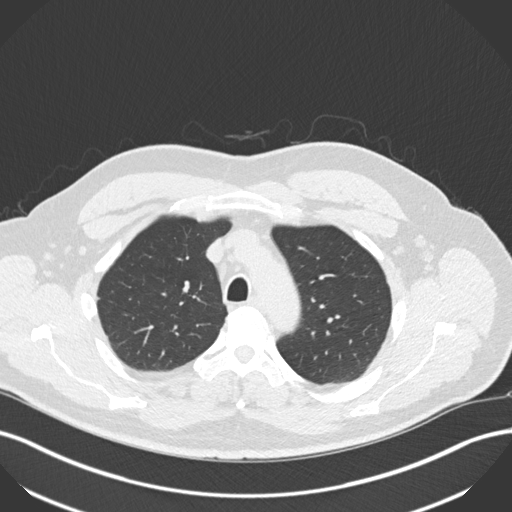
[im 145/185  lung]
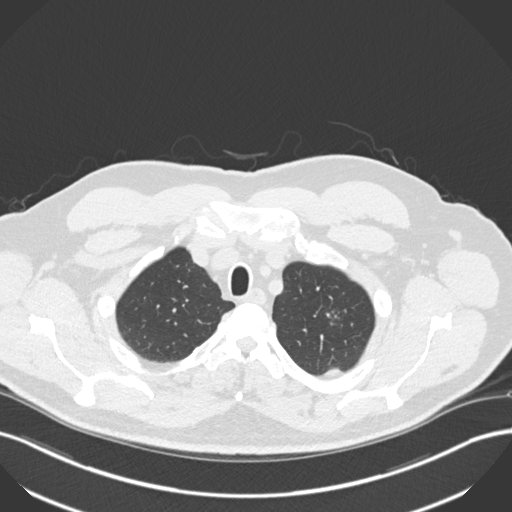
[im 158/185  lung]
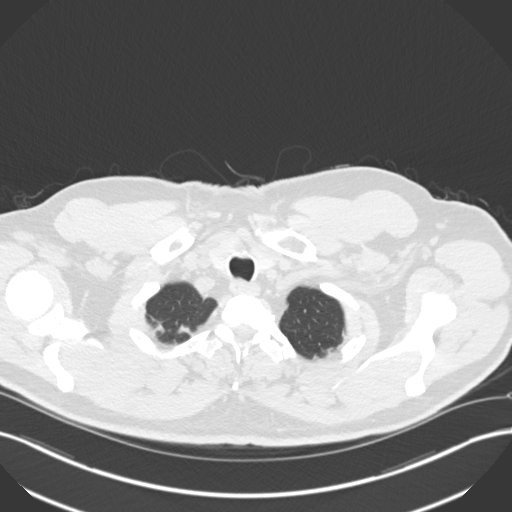
[im 171/185  lung]
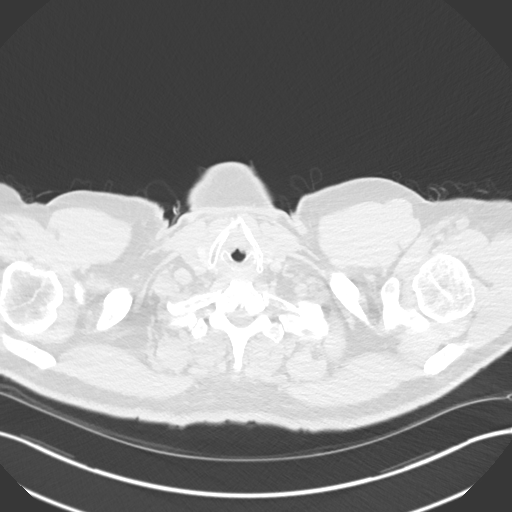

[Series 5: coronal · coronal · 0.72mm/px · 3 of 143 slices shown]
[im 29/143  lung]
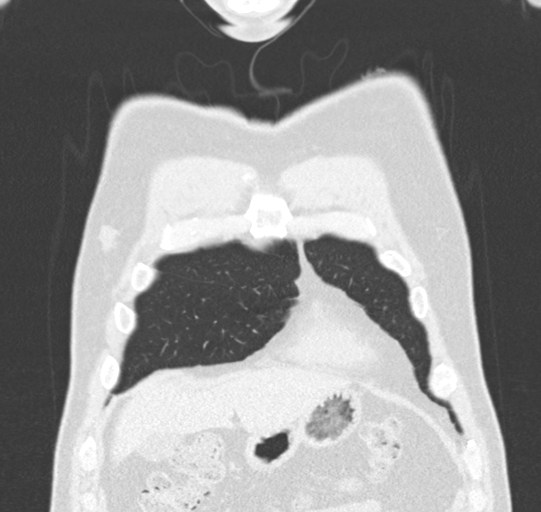
[im 57/143  lung]
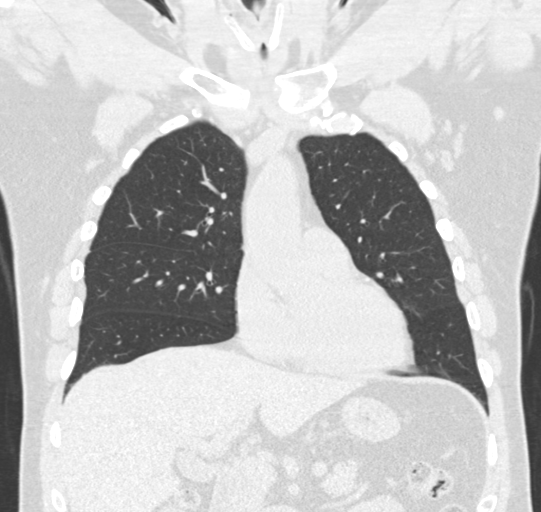
[im 86/143  lung]
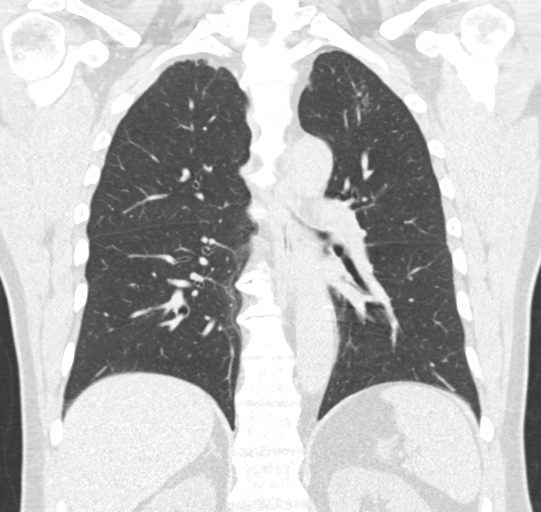

[15 of 36 positions shown; findings below may reference images not displayed]

FINDINGS: Cardiovascular: No significant vascular findings. Normal heart size.
No pericardial effusion.

Mediastinum/Nodes: No axillary or supraclavicular adenopathy. No
mediastinal or hilar adenopathy. No pericardial fluid. Esophagus
normal.

Lungs/Pleura: 6 mm nodule in the RIGHT middle lobe is unchanged from
prior. Nodule also present CT 04/10/2019.

New cluster of nodules in the LEFT lung apex (image 89/coronal
series 5). These nodules are 1 mm and and clustered in a pulmonary
infection pattern

Upper Abdomen: Limited view of the liver, kidneys, pancreas are
unremarkable. Normal adrenal glands.

Musculoskeletal: No aggressive osseous lesion.
IMPRESSION: 1. Stable benign nodule in the RIGHT middle lobe.
2. A cluster of nodules in the LEFT lung apex present a pulmonary
infection pattern.
3. In patient with smoking history, consider annual low-dose CT lung
cancer screening

## 2021-05-18 DIAGNOSIS — C44619 Basal cell carcinoma of skin of left upper limb, including shoulder: Secondary | ICD-10-CM | POA: Diagnosis not present

## 2021-05-18 DIAGNOSIS — L570 Actinic keratosis: Secondary | ICD-10-CM | POA: Diagnosis not present

## 2021-05-18 DIAGNOSIS — Z85828 Personal history of other malignant neoplasm of skin: Secondary | ICD-10-CM | POA: Diagnosis not present

## 2021-05-18 DIAGNOSIS — L814 Other melanin hyperpigmentation: Secondary | ICD-10-CM | POA: Diagnosis not present

## 2021-05-18 DIAGNOSIS — C44519 Basal cell carcinoma of skin of other part of trunk: Secondary | ICD-10-CM | POA: Diagnosis not present

## 2021-05-18 DIAGNOSIS — L821 Other seborrheic keratosis: Secondary | ICD-10-CM | POA: Diagnosis not present

## 2021-05-18 DIAGNOSIS — D225 Melanocytic nevi of trunk: Secondary | ICD-10-CM | POA: Diagnosis not present

## 2021-07-21 ENCOUNTER — Other Ambulatory Visit: Payer: Self-pay | Admitting: Family Medicine

## 2021-07-21 ENCOUNTER — Encounter: Payer: Self-pay | Admitting: Family Medicine

## 2021-07-21 MED ORDER — ROSUVASTATIN CALCIUM 10 MG PO TABS: 10.0000 mg | ORAL_TABLET | Freq: Every day | ORAL | 3 refills | Status: DC

## 2021-07-22 ENCOUNTER — Telehealth: Payer: Self-pay | Admitting: *Deleted

## 2021-07-22 NOTE — Telephone Encounter (Signed)
RETURNED PATIENT'S PHONE CALL, SPOKE WITH PATIENT. ?

## 2021-09-26 ENCOUNTER — Telehealth: Payer: Self-pay | Admitting: *Deleted

## 2021-09-26 NOTE — Telephone Encounter (Signed)
Returned patient's phone call, spoke with patient 

## 2021-10-01 ENCOUNTER — Encounter: Payer: Self-pay | Admitting: Family Medicine

## 2021-10-04 ENCOUNTER — Other Ambulatory Visit: Payer: Self-pay

## 2021-10-04 DIAGNOSIS — F4323 Adjustment disorder with mixed anxiety and depressed mood: Secondary | ICD-10-CM

## 2021-10-05 ENCOUNTER — Telehealth: Payer: Self-pay | Admitting: *Deleted

## 2021-10-05 ENCOUNTER — Other Ambulatory Visit: Payer: Self-pay | Admitting: Family Medicine

## 2021-10-05 DIAGNOSIS — F4323 Adjustment disorder with mixed anxiety and depressed mood: Secondary | ICD-10-CM

## 2021-10-05 MED ORDER — DIAZEPAM 5 MG PO TABS: ORAL_TABLET | ORAL | 0 refills | Status: DC

## 2021-10-05 NOTE — Telephone Encounter (Signed)
Rx sent on 10/04/2021 by Dr. Yong Channel.

## 2021-10-05 NOTE — Progress Notes (Signed)
I refilled rx

## 2021-10-05 NOTE — Telephone Encounter (Signed)
CALLED PATIENT TO INFORM THAT CT HAS BEEN MOVED TO 10-28-21 @ [facility], NO RESTRICTIONS TO TEST, SPOKE WITH PATIENT AND HE IS AWARE OF THIS CHANGE AND IS GOOD WITH THIS CHANGE

## 2021-10-18 ENCOUNTER — Ambulatory Visit: Payer: BC Managed Care – PPO | Admitting: Family Medicine

## 2021-10-18 ENCOUNTER — Encounter: Payer: Self-pay | Admitting: Family Medicine

## 2021-10-18 ENCOUNTER — Other Ambulatory Visit: Payer: Self-pay

## 2021-10-18 VITALS — BP 108/62 | HR 74 | Temp 97.6°F | Ht 71.0 in | Wt 222.2 lb

## 2021-10-18 DIAGNOSIS — Z833 Family history of diabetes mellitus: Secondary | ICD-10-CM

## 2021-10-18 DIAGNOSIS — R809 Proteinuria, unspecified: Secondary | ICD-10-CM

## 2021-10-18 DIAGNOSIS — M79662 Pain in left lower leg: Secondary | ICD-10-CM | POA: Diagnosis not present

## 2021-10-18 DIAGNOSIS — E785 Hyperlipidemia, unspecified: Secondary | ICD-10-CM

## 2021-10-18 DIAGNOSIS — R82998 Other abnormal findings in urine: Secondary | ICD-10-CM | POA: Diagnosis not present

## 2021-10-18 DIAGNOSIS — Z125 Encounter for screening for malignant neoplasm of prostate: Secondary | ICD-10-CM | POA: Diagnosis not present

## 2021-10-18 DIAGNOSIS — Z0000 Encounter for general adult medical examination without abnormal findings: Secondary | ICD-10-CM

## 2021-10-18 DIAGNOSIS — I700 Atherosclerosis of aorta: Secondary | ICD-10-CM

## 2021-10-18 DIAGNOSIS — F17200 Nicotine dependence, unspecified, uncomplicated: Secondary | ICD-10-CM

## 2021-10-18 DIAGNOSIS — E875 Hyperkalemia: Secondary | ICD-10-CM

## 2021-10-18 DIAGNOSIS — S83241A Other tear of medial meniscus, current injury, right knee, initial encounter: Secondary | ICD-10-CM | POA: Diagnosis not present

## 2021-10-18 LAB — CBC WITH DIFFERENTIAL/PLATELET
Basophils Absolute: 0 10*3/uL (ref 0.0–0.1)
Basophils Relative: 0.6 % (ref 0.0–3.0)
Eosinophils Absolute: 0.2 10*3/uL (ref 0.0–0.7)
Eosinophils Relative: 3.3 % (ref 0.0–5.0)
HCT: 44.4 % (ref 39.0–52.0)
Hemoglobin: 15.2 g/dL (ref 13.0–17.0)
Lymphocytes Relative: 31.3 % (ref 12.0–46.0)
Lymphs Abs: 1.8 10*3/uL (ref 0.7–4.0)
MCHC: 34.2 g/dL (ref 30.0–36.0)
MCV: 97.5 fl (ref 78.0–100.0)
Monocytes Absolute: 0.6 10*3/uL (ref 0.1–1.0)
Monocytes Relative: 10.6 % (ref 3.0–12.0)
Neutro Abs: 3.2 10*3/uL (ref 1.4–7.7)
Neutrophils Relative %: 54.2 % (ref 43.0–77.0)
Platelets: 284 10*3/uL (ref 150.0–400.0)
RBC: 4.56 Mil/uL (ref 4.22–5.81)
RDW: 13.2 % (ref 11.5–15.5)
WBC: 5.8 10*3/uL (ref 4.0–10.5)

## 2021-10-18 LAB — LIPID PANEL
Cholesterol: 172 mg/dL (ref 0–200)
HDL: 45.5 mg/dL (ref >39.00)
NonHDL: 126.42
Total CHOL/HDL Ratio: 4
Triglycerides: 272 mg/dL — ABNORMAL HIGH (ref 0.0–149.0)
VLDL: 54.4 mg/dL — ABNORMAL HIGH (ref 0.0–40.0)

## 2021-10-18 LAB — POC URINALSYSI DIPSTICK (AUTOMATED)
Bilirubin, UA: POSITIVE
Blood, UA: NEGATIVE
Glucose, UA: NEGATIVE
Ketones, UA: POSITIVE
Nitrite, UA: NEGATIVE
Protein, UA: POSITIVE — AB
Spec Grav, UA: >=1.03 — AB (ref 1.010–1.025)
Urobilinogen, UA: 1 E.U./dL
pH, UA: 5 (ref 5.0–8.0)

## 2021-10-18 LAB — COMPREHENSIVE METABOLIC PANEL
ALT: 29 U/L (ref 0–53)
AST: 32 U/L (ref 0–37)
Albumin: 4.3 g/dL (ref 3.5–5.2)
Alkaline Phosphatase: 64 U/L (ref 39–117)
BUN: 18 mg/dL (ref 6–23)
CO2: 27 mEq/L (ref 19–32)
Calcium: 9.8 mg/dL (ref 8.4–10.5)
Chloride: 103 mEq/L (ref 96–112)
Creatinine, Ser: 1.13 mg/dL (ref 0.40–1.50)
GFR: 68.41 mL/min (ref >60.00)
Glucose, Bld: 92 mg/dL (ref 70–99)
Potassium: 5.2 mEq/L — ABNORMAL HIGH (ref 3.5–5.1)
Sodium: 137 mEq/L (ref 135–145)
Total Bilirubin: 0.6 mg/dL (ref 0.2–1.2)
Total Protein: 6.7 g/dL (ref 6.0–8.3)

## 2021-10-18 LAB — PSA: PSA: 1.28 ng/mL (ref 0.10–4.00)

## 2021-10-18 LAB — HEMOGLOBIN A1C: Hgb A1c MFr Bld: 5.6 % (ref 4.6–6.5)

## 2021-10-18 LAB — LDL CHOLESTEROL, DIRECT: Direct LDL: 111 mg/dL

## 2021-10-18 NOTE — Progress Notes (Signed)
Phone [phone] In person visit   Subjective:   Dakota Barker is a 66 y.o. year old very pleasant male patient who presents for/with See problem oriented charting Chief Complaint  Patient presents with   cramps    Pt c/o leg cramps in both legs that has been around for a while and he uses mustard that helps.   spot on left shoulder   left calf pain    Pt states he thinks he blew out his left calf cutting grass.    shakes in the morning    Pt states diabetes runs in his family and he has been having shakes in the mornings and he thinks his sugars are low.    Past Medical History-  Patient Active Problem List   Diagnosis Date Noted   Incidental lung nodule, > 30m and < 831m06/07/2019    Priority: High   Tobacco use disorder 02/23/2009    Priority: High   Aortic atherosclerosis ([place]06/13/2023    Priority: Medium    Benign paroxysmal positional vertigo 07/10/2019    Priority: Medium    Anxiety state 01/03/2016    Priority: Medium    Palpitations 06/05/2013    Priority: Medium    Hyperlipidemia, mild 12/10/2007    Priority: Medium    Benign essential tremor 12/10/2007    Priority: Medium    Reactive airway disease 08/03/2015    Priority: Low   Erectile dysfunction 03/05/2014    Priority: Low   Microscopic hematuria 12/10/2007    Priority: Low   Allergic rhinitis 01/04/2007    Priority: Low   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     diazepam (VALIUM) 5 MG tablet TAKE 1 TABLET AS NEEDED ONCE DAILY FOR FLYING.do not drive for 8-6-07ours after use. 30 tablet 0   fish oil-omega-3 fatty acids 1000 MG capsule Take 2 g by mouth daily.     ibuprofen (ADVIL,MOTRIN) 200 MG tablet Take 600-800 mg by mouth every 6 (six) hours as needed for moderate pain.     Multiple Vitamin (MULTIVITAMIN)  tablet Take 1 tablet by mouth daily.     rosuvastatin (CRESTOR) 10 MG tablet Take 1 tablet (10 mg total) by mouth daily. 90 tablet 3   No current facility-administered medications for this visit.     Objective:  BP 108/62   Pulse 74   Temp 97.6 F (36.4 C)   Ht '5\' 11"'$  (1.803 m)   Wt 222 lb 3.2 oz (100.8 kg)   SpO2 95%   BMI 30.99 kg/m  Gen: NAD, resting comfortably Ext: no edema Skin: warm, dry MSK: very tender on left calf- no bruise. Thompson squeeze test negative.     Assessment and Plan   #Left calf pain S: Started having left calf pain after cutting his grass. Was pushing up a hill with lawnmower and felt an immediate strain. This morning feels somewhat better pain with walking 6-7/10  and limping to be cautious. Was even worse last night. No history of injury to calf prior.  In cup of coffee have some A/P: Significant pain on exam and limps with walking- recommended ortho follo wup- he has a relationship with Dr. BoMoshe Salisbury- will give them a call. In short run recommended icing 2086m 3x a day- short term  nsaids ok- took aleve last night  #Cramps S: Patient has noted cramps in both legs has been going on for a while particularly if plays golf (calves, hands, groin) -mustard seems to be helpful- magnesium, bananas, gatorade, pickle juice not as helpful. PLays golf 1-2 days a week.  A/P: Cramps that respond well to mustard- likely electrolyte issue- statin he is on likely contributes.   - also discussed potentially trying off rosuvastatin on nights that he plays golf  #hyperlipidemia #aortic atherosclerosis- noted on CT chest 11/2020 S: Medication: Rosuvastatin 10 mg, aspirin '81mg'$  Lab Results  Component Value Date   CHOL 153 07/10/2019   HDL 40.80 07/10/2019   LDLCALC 92 07/10/2019   LDLDIRECT 120.0 03/08/2018   TRIG 102.0 07/10/2019   CHOLHDL 4 07/10/2019   A/P: hopefully stable- update lipid panel today. Continue current meds for now. Will tolerate LDL under 100  for primary prevention- under 70 even better as on last chest ct had slight aortic calcifications/aortic atherosclerosis (noted new diagnosis from last year   #Shaking in the morning/family history diabetes S: Patient reports history of diabetes in his family.  He feels shaky in the morning and wonders if his sugar to be low.  We have checked A1c's in the past and not elevated at least since 2018 most recently about 2 years ago A/P: we discussed even if doesn't have diabetes could have lower sugars in the morning- could try a bedtime snack to see if that makes a difference. We will check a1c due to family history though.    #Skin lesion S:Typically sees Dr. Wilhemina Bonito scheduled dermatology. 2 months and saw DR. Jones 4-5 months ago A/P: recommended follow up with Dr. Ronnald Ramp- pinkish colored lesion on left shoulder- he is not sure if this is prior biopsy site or not so recommended follow up with DR. Ronnald Ramp with history of basal cell carcinoma.   #smoking- still smoking cigars some- recommended cessation.   Recommended follow up: Return for next already scheduled visit or sooner if needed. Future Appointments  Date Time Provider [place]  10/28/2021  1:30 PM [facility] 2 [facility] [place]  11/11/2021 10:00 AM Bruning, Ashlyn, PA-C [facility] None  03/28/2022  2:00 PM Marin Olp, MD [facility]   Lab/Order associations: smoothie today and cup of cofee   ICD-10-CM   1. Hyperlipidemia, mild  E78.5     2. Family history of diabetes mellitus  Z83.3     3. Screening for prostate cancer  Z12.5     4. Tobacco use disorder  F17.200     5. Aortic atherosclerosis (HCC)  I70.0       No orders of the defined types were placed in this encounter.   Return precautions advised.  Garret Reddish, MD

## 2021-10-18 NOTE — Patient Instructions (Addendum)
Consider holding rosuvastatin on golf days.   Follow up with Dr. Ronnald Ramp for Shoulder and Dr. Noemi Chapel for   Please stop by lab before you go If you have mychart- we will send your results within 3 business days of [place] receiving them.  If you do not have mychart- we will call you about results within 5 business days of [place] receiving them.  *please also note that you will see labs on mychart as soon as they post. I will later go in and write notes on them- will say "notes from Dr. Yong Channel"   Recommended follow up: Return for next already scheduled visit or sooner if needed.

## 2021-10-18 NOTE — Addendum Note (Signed)
Addended by: Doran Clay A on: 10/18/2021 11:54 AM   Modules accepted: Orders

## 2021-10-19 LAB — URINE CULTURE
MICRO NUMBER:: 13518806
Result:: NO GROWTH
SPECIMEN QUALITY:: ADEQUATE

## 2021-10-20 ENCOUNTER — Other Ambulatory Visit (INDEPENDENT_AMBULATORY_CARE_PROVIDER_SITE_OTHER): Payer: BC Managed Care – PPO

## 2021-10-20 ENCOUNTER — Other Ambulatory Visit: Payer: Self-pay

## 2021-10-20 DIAGNOSIS — R82998 Other abnormal findings in urine: Secondary | ICD-10-CM | POA: Diagnosis not present

## 2021-10-20 DIAGNOSIS — E875 Hyperkalemia: Secondary | ICD-10-CM | POA: Diagnosis not present

## 2021-10-21 LAB — URINALYSIS, ROUTINE W REFLEX MICROSCOPIC
Bilirubin Urine: NEGATIVE
Hgb urine dipstick: NEGATIVE
Ketones, ur: NEGATIVE
Leukocytes,Ua: NEGATIVE
Nitrite: NEGATIVE
Specific Gravity, Urine: 1.015 (ref 1.000–1.030)
Total Protein, Urine: NEGATIVE
Urine Glucose: NEGATIVE
Urobilinogen, UA: 0.2 (ref 0.0–1.0)
pH: 5.5 (ref 5.0–8.0)

## 2021-10-21 LAB — BASIC METABOLIC PANEL
BUN: 19 mg/dL (ref 6–23)
CO2: 23 mEq/L (ref 19–32)
Calcium: 9.2 mg/dL (ref 8.4–10.5)
Chloride: 104 mEq/L (ref 96–112)
Creatinine, Ser: 1.03 mg/dL (ref 0.40–1.50)
GFR: 76.46 mL/min (ref >60.00)
Glucose, Bld: 67 mg/dL — ABNORMAL LOW (ref 70–99)
Potassium: 4.8 mEq/L (ref 3.5–5.1)
Sodium: 136 mEq/L (ref 135–145)

## 2021-10-28 ENCOUNTER — Ambulatory Visit (HOSPITAL_COMMUNITY)
Admission: RE | Admit: 2021-10-28 | Discharge: 2021-10-28 | Disposition: A | Discharge status: Home / Self Care | Payer: BC Managed Care – PPO | Source: Ambulatory Visit | Attending: Urology | Admitting: Urology

## 2021-10-28 DIAGNOSIS — R918 Other nonspecific abnormal finding of lung field: Secondary | ICD-10-CM | POA: Diagnosis not present

## 2021-10-28 DIAGNOSIS — R911 Solitary pulmonary nodule: Secondary | ICD-10-CM | POA: Diagnosis not present

## 2021-11-10 ENCOUNTER — Encounter: Payer: Self-pay | Admitting: Urology

## 2021-11-10 NOTE — Progress Notes (Signed)
Telephone appointment. I verified patient's identity and began nursing interview. Patient states "No complaints, everything is the same, and doing well."  Meaningful use complete.  Reminded patient of his 10:00am-11/11/21 telephone appointment w/ Ashlyn Bruning PA-C. I left my extension [phone] in case patient needs anything. Patient verbalized understanding.  Patient contact [phone]

## 2021-11-11 ENCOUNTER — Ambulatory Visit
Admission: RE | Admit: 2021-11-11 | Discharge: 2021-11-11 | Disposition: A | Discharge status: Home / Self Care | Payer: BC Managed Care – PPO | Source: Ambulatory Visit | Attending: Urology | Admitting: Urology

## 2021-11-11 DIAGNOSIS — R911 Solitary pulmonary nodule: Secondary | ICD-10-CM

## 2021-11-11 DIAGNOSIS — C44311 Basal cell carcinoma of skin of nose: Secondary | ICD-10-CM | POA: Diagnosis not present

## 2021-11-11 NOTE — Progress Notes (Signed)
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    Interval Since Last Radiation:  4 years  06/21/2017 - 08/01/2017:  Nose / 2Gy x 30 fractions  03/05/1995 to 04/04/1995: Nasal tip treated to 56.25 Gy in 25 fractions  Narrative: I spoke with the patient by telephone today to review the results from his recent CT chest that was performed on 10/28/21. The patient was notified in advance and gave permission to proceed with this visit format.  He had a CT Chest in 04/2019 for evaluation of persistent cough with his PCP.  This scan showed a nonspecific 5 mm subpleural nodule along the minor fissure. It was mentioned that a non-contrast chest CT could be considered in 12 months for patients considered to be high-risk. He does smoke cigars regularly and has remained highly concerned regarding the new nodule on CT and preferred to continue with annual repeat imaging which fortunately has remained stable.     Interval history:   His recent CT chest that was performed on 10/28/21 revealed a continued stable appearance of the 5 mm perifissural right middle lobe pulmonary nodule that we have been following since December 2020.  Per radiology report, given that this nodule has demonstrated over 2 years of stability, it is favored as benign and no further follow-up is felt necessary. He remains asymptomatic and is currently without complaints.  He is a cigar smoker, so is likely a candidate for the lung cancer screening program through [facility] pulmonology. We discussed this today and he is on board so I will reach out to Eric Form, NP to help coordinate. He is comfortable and in agreement with the stated plan.   He knows that he is welcome to call at anytime in the interim with any questions or concerns.  On review of systems, the patient states that he is doing very well overall.  He is currently without complaints.  He specifically denies chest pain, shortness of breath, productive cough or hemoptysis.  He has continued playing golf regularly.  He denies recent fevers, chills or night sweats.  He reports a healthy appetite and is maintaining his weight.  ALLERGIES:  has No Known Allergies.  Meds: Current Outpatient Medications  Medication Sig Dispense Refill   aspirin EC 81 MG tablet Take 1 tablet (81 mg total) by mouth daily.     diazepam (VALIUM) 5 MG tablet TAKE 1 TABLET AS NEEDED ONCE DAILY FOR FLYING.do not drive for 1-24 hours after use. 30 tablet 0   fish oil-omega-3 fatty acids 1000 MG capsule Take 2 g by mouth daily.     ibuprofen (ADVIL,MOTRIN) 200 MG tablet Take 600-800 mg by mouth every 6 (six) hours as needed for moderate pain.     Multiple Vitamin (MULTIVITAMIN) tablet Take 1 tablet by mouth daily.     rosuvastatin (CRESTOR) 10 MG tablet Take 1 tablet (10 mg total) by mouth daily. 90 tablet 3   No current facility-administered medications for this encounter.    Physical Findings:  vitals were not taken for this visit.  Pain Assessment Pain Score: 0-No pain/Unable to assess due to telephone follow up visit format.  Lab Findings: Lab  Results  Component Value Date   WBC 5.8 10/18/2021   HGB 15.2 10/18/2021   HCT 44.4 10/18/2021   MCV 97.5 10/18/2021   PLT 284.0 10/18/2021     Radiographic Findings: CT Chest Wo Contrast  Result Date: 10/28/2021 CLINICAL DATA:  Lung nodule. EXAM: CT CHEST WITHOUT CONTRAST TECHNIQUE: Multidetector CT imaging of the chest was performed following the standard protocol without IV contrast. RADIATION DOSE REDUCTION: This exam was performed according to the departmental dose-optimization program which includes automated exposure control, adjustment  of the mA and/or kV according to patient size and/or use of iterative reconstruction technique. COMPARISON:  CT chest 11/29/2020. chest CT 04/10/2019. FINDINGS: Cardiovascular: No significant vascular findings. Normal heart size. No pericardial effusion. There are atherosclerotic calcifications of the aorta. Mediastinum/Nodes: No enlarged mediastinal or axillary lymph nodes. Thyroid gland, trachea, and esophagus demonstrate no significant findings. Lungs/Pleura: There is a stable 5 mm perifissural nodule in the right middle lobe. No new nodules are identified. There is linear atelectasis or scarring in the right lower lobe. The lungs are otherwise clear. No pleural effusion or pneumothorax identified. Upper Abdomen: No acute abnormality. Musculoskeletal: Bilateral gynecomastia again seen. No acute fracture. No focal osseous lesion. IMPRESSION: 1. 5 mm right middle lobe nodule demonstrates over 2 years of stability and is favored as benign. No follow-up necessary. 2. No acute cardiopulmonary process. Electronically Signed   By: Ronney Asters M.D.   On: 10/28/2021 21:36    Impression/Plan: 65. 66 yo man with h/o cutaneous basal cell carcinoma of the nose, 22 years after previous radiation for same.   He remains asymptomatic and is currently without complaints.  He is a cigar smoker, so is likely a candidate for the lung cancer screening program through [facility] pulmonology. We discussed this today and he is on board so I will reach out to Eric Form, NP to help coordinate. He is comfortable and in agreement with the stated plan.  He knows that he is welcome to call at anytime in the interim with any questions or concerns.   I personally spent 10 minutes in this encounter including chart review, reviewing radiological studies, telephone conversation with the patient, coordinating care and completing documentation.    Nicholos Johns, PA-C    Tyler Pita, MD  [facility]  Dial: [phone]  Fax: [phone] [email or web address]  Skype  LinkedIn

## 2021-11-28 ENCOUNTER — Ambulatory Visit (HOSPITAL_COMMUNITY): Payer: BC Managed Care – PPO

## 2021-11-29 ENCOUNTER — Other Ambulatory Visit: Payer: Self-pay | Admitting: Urology

## 2021-11-29 DIAGNOSIS — R911 Solitary pulmonary nodule: Secondary | ICD-10-CM

## 2021-11-29 DIAGNOSIS — R918 Other nonspecific abnormal finding of lung field: Secondary | ICD-10-CM

## 2021-11-30 ENCOUNTER — Ambulatory Visit: Payer: Self-pay | Admitting: Urology

## 2021-12-15 DIAGNOSIS — L570 Actinic keratosis: Secondary | ICD-10-CM | POA: Diagnosis not present

## 2021-12-15 DIAGNOSIS — L821 Other seborrheic keratosis: Secondary | ICD-10-CM | POA: Diagnosis not present

## 2021-12-15 DIAGNOSIS — Z85828 Personal history of other malignant neoplasm of skin: Secondary | ICD-10-CM | POA: Diagnosis not present

## 2021-12-15 DIAGNOSIS — L905 Scar conditions and fibrosis of skin: Secondary | ICD-10-CM | POA: Diagnosis not present

## 2022-01-30 ENCOUNTER — Encounter: Payer: Self-pay | Admitting: *Deleted

## 2022-03-24 ENCOUNTER — Telehealth: Payer: Self-pay | Admitting: Family Medicine

## 2022-03-24 ENCOUNTER — Encounter: Payer: Self-pay | Admitting: Family Medicine

## 2022-03-24 MED ORDER — FLUTICASONE PROPIONATE 50 MCG/ACT NA SUSP: 2.0000 | Freq: Every day | NASAL | 3 refills | Status: DC

## 2022-03-24 NOTE — Telephone Encounter (Signed)
I sent him a MyChart message and sent in [facility]

## 2022-03-24 NOTE — Addendum Note (Signed)
Addended by: Marin Olp on: 03/24/2022 09:13 PM   Modules accepted: Orders

## 2022-03-24 NOTE — Telephone Encounter (Signed)
Please see message and advise 

## 2022-03-24 NOTE — Telephone Encounter (Addendum)
Pt states: -Every year PCP team prescribes a nasal spray and oral medication for his ears. -Ears have started to clog up. -Leaving town on Sunday 03/26/22 -Pharmacy is open on Saturday 03/25/22   Pt requests: -prescriptions be sent to pharmacy. -call back if pcp team is unable to write prescription.   Pt declined appointment  [facility]

## 2022-03-28 ENCOUNTER — Encounter: Payer: BC Managed Care – PPO | Admitting: Family Medicine

## 2022-04-20 ENCOUNTER — Encounter: Payer: Self-pay | Admitting: *Deleted

## 2022-05-04 ENCOUNTER — Other Ambulatory Visit: Payer: Self-pay | Admitting: Neurological Surgery

## 2022-05-04 DIAGNOSIS — M5412 Radiculopathy, cervical region: Secondary | ICD-10-CM

## 2022-05-04 DIAGNOSIS — Z6832 Body mass index (BMI) 32.0-32.9, adult: Secondary | ICD-10-CM | POA: Diagnosis not present

## 2022-06-05 ENCOUNTER — Ambulatory Visit
Admission: RE | Admit: 2022-06-05 | Discharge: 2022-06-05 | Disposition: A | Discharge status: Home / Self Care | Payer: BC Managed Care – PPO | Source: Ambulatory Visit | Attending: Neurological Surgery | Admitting: Neurological Surgery

## 2022-06-05 DIAGNOSIS — R200 Anesthesia of skin: Secondary | ICD-10-CM | POA: Diagnosis not present

## 2022-06-05 DIAGNOSIS — M47812 Spondylosis without myelopathy or radiculopathy, cervical region: Secondary | ICD-10-CM | POA: Diagnosis not present

## 2022-06-05 DIAGNOSIS — M5412 Radiculopathy, cervical region: Secondary | ICD-10-CM

## 2022-06-08 DIAGNOSIS — M5412 Radiculopathy, cervical region: Secondary | ICD-10-CM | POA: Diagnosis not present

## 2022-07-10 ENCOUNTER — Other Ambulatory Visit: Payer: Self-pay | Admitting: Family Medicine

## 2022-07-28 ENCOUNTER — Ambulatory Visit (INDEPENDENT_AMBULATORY_CARE_PROVIDER_SITE_OTHER): Payer: BC Managed Care – PPO | Admitting: Family Medicine

## 2022-07-28 ENCOUNTER — Encounter: Payer: Self-pay | Admitting: Family Medicine

## 2022-07-28 VITALS — BP 120/64 | HR 71 | Temp 98.0°F | Ht 71.0 in | Wt 223.4 lb

## 2022-07-28 DIAGNOSIS — Z131 Encounter for screening for diabetes mellitus: Secondary | ICD-10-CM

## 2022-07-28 DIAGNOSIS — Z0000 Encounter for general adult medical examination without abnormal findings: Secondary | ICD-10-CM | POA: Diagnosis not present

## 2022-07-28 DIAGNOSIS — Z125 Encounter for screening for malignant neoplasm of prostate: Secondary | ICD-10-CM | POA: Diagnosis not present

## 2022-07-28 DIAGNOSIS — Z23 Encounter for immunization: Secondary | ICD-10-CM | POA: Diagnosis not present

## 2022-07-28 DIAGNOSIS — F17200 Nicotine dependence, unspecified, uncomplicated: Secondary | ICD-10-CM

## 2022-07-28 DIAGNOSIS — F4323 Adjustment disorder with mixed anxiety and depressed mood: Secondary | ICD-10-CM | POA: Diagnosis not present

## 2022-07-28 DIAGNOSIS — I700 Atherosclerosis of aorta: Secondary | ICD-10-CM

## 2022-07-28 DIAGNOSIS — E785 Hyperlipidemia, unspecified: Secondary | ICD-10-CM | POA: Diagnosis not present

## 2022-07-28 DIAGNOSIS — E669 Obesity, unspecified: Secondary | ICD-10-CM | POA: Diagnosis not present

## 2022-07-28 DIAGNOSIS — Z136 Encounter for screening for cardiovascular disorders: Secondary | ICD-10-CM

## 2022-07-28 LAB — CBC WITH DIFFERENTIAL/PLATELET
Basophils Absolute: 0 10*3/uL (ref 0.0–0.1)
Basophils Relative: 0.6 % (ref 0.0–3.0)
Eosinophils Absolute: 0.2 10*3/uL (ref 0.0–0.7)
Eosinophils Relative: 2.9 % (ref 0.0–5.0)
HCT: 48.6 % (ref 39.0–52.0)
Hemoglobin: 16.3 g/dL (ref 13.0–17.0)
Lymphocytes Relative: 36.6 % (ref 12.0–46.0)
Lymphs Abs: 2.3 10*3/uL (ref 0.7–4.0)
MCHC: 33.5 g/dL (ref 30.0–36.0)
MCV: 97.9 fl (ref 78.0–100.0)
Monocytes Absolute: 0.7 10*3/uL (ref 0.1–1.0)
Monocytes Relative: 10.6 % (ref 3.0–12.0)
Neutro Abs: 3.1 10*3/uL (ref 1.4–7.7)
Neutrophils Relative %: 49.3 % (ref 43.0–77.0)
Platelets: 270 10*3/uL (ref 150.0–400.0)
RBC: 4.96 Mil/uL (ref 4.22–5.81)
RDW: 13.3 % (ref 11.5–15.5)
WBC: 6.3 10*3/uL (ref 4.0–10.5)

## 2022-07-28 LAB — LIPID PANEL
Cholesterol: 175 mg/dL (ref 0–200)
HDL: 50.1 mg/dL (ref >39.00)
LDL Cholesterol: 95 mg/dL (ref 0–99)
NonHDL: 124.45
Total CHOL/HDL Ratio: 3
Triglycerides: 147 mg/dL (ref 0.0–149.0)
VLDL: 29.4 mg/dL (ref 0.0–40.0)

## 2022-07-28 LAB — URINALYSIS, ROUTINE W REFLEX MICROSCOPIC
Bilirubin Urine: NEGATIVE
Hgb urine dipstick: NEGATIVE
Ketones, ur: NEGATIVE
Leukocytes,Ua: NEGATIVE
Nitrite: NEGATIVE
Specific Gravity, Urine: 1.025 (ref 1.000–1.030)
Total Protein, Urine: NEGATIVE
Urine Glucose: NEGATIVE
Urobilinogen, UA: 0.2 (ref 0.0–1.0)
pH: 6 (ref 5.0–8.0)

## 2022-07-28 LAB — COMPREHENSIVE METABOLIC PANEL
ALT: 20 U/L (ref 0–53)
AST: 29 U/L (ref 0–37)
Albumin: 4.7 g/dL (ref 3.5–5.2)
Alkaline Phosphatase: 68 U/L (ref 39–117)
BUN: 25 mg/dL — ABNORMAL HIGH (ref 6–23)
CO2: 27 mEq/L (ref 19–32)
Calcium: 10 mg/dL (ref 8.4–10.5)
Chloride: 107 mEq/L (ref 96–112)
Creatinine, Ser: 1.08 mg/dL (ref 0.40–1.50)
GFR: 71.84 mL/min (ref >60.00)
Glucose, Bld: 93 mg/dL (ref 70–99)
Potassium: 5.7 mEq/L — ABNORMAL HIGH (ref 3.5–5.1)
Sodium: 144 mEq/L (ref 135–145)
Total Bilirubin: 0.6 mg/dL (ref 0.2–1.2)
Total Protein: 7.3 g/dL (ref 6.0–8.3)

## 2022-07-28 LAB — HEMOGLOBIN A1C: Hgb A1c MFr Bld: 5.7 % (ref 4.6–6.5)

## 2022-07-28 LAB — PSA: PSA: 1.45 ng/mL (ref 0.10–4.00)

## 2022-07-28 MED ORDER — DIAZEPAM 5 MG PO TABS: ORAL_TABLET | ORAL | 0 refills | Status: DC

## 2022-07-28 NOTE — Addendum Note (Signed)
Addended by: Jodell Cipro on: 07/28/2022 09:52 AM   Modules accepted: Orders

## 2022-07-28 NOTE — Progress Notes (Signed)
Phone: [phone]   Subjective:  Patient presents today for their annual physical. Chief complaint-noted.   See problem oriented charting- ROS- full  review of systems was completed and negative  except for: mild popping right ear- Flonase or auto insufflation help- improving, ongoing cough- long term cigar smoker- advised to quit, cramping, hearing issues with right ear but improving as improving as it does yearly  The following were reviewed and entered/updated in epic: Past Medical History:  Diagnosis Date   ADHD (attention deficit hyperactivity disorder)    no treatment   Allergy    seasonal   Hematuria    Hyperlipidemia    Skin cancer    nose- radiation only. Cared for regularly Dr. Wilhemina Bonito.   Tremor    benign, essential   Patient Active Problem List   Diagnosis Date Noted   Incidental lung nodule, > 71mm and < 76mm 10/09/2019    Priority: High   Tobacco use disorder 02/23/2009    Priority: High   Aortic atherosclerosis ([place]) 10/18/2021    Priority: Medium    Benign paroxysmal positional vertigo 07/10/2019    Priority: Medium    Anxiety state 01/03/2016    Priority: Medium    Palpitations 06/05/2013    Priority: Medium    Hyperlipidemia, mild 12/10/2007    Priority: Medium    Benign essential tremor 12/10/2007    Priority: Medium    Reactive airway disease 08/03/2015    Priority: Low   Erectile dysfunction 03/05/2014    Priority: Low   Microscopic hematuria 12/10/2007    Priority: Low   Allergic rhinitis 01/04/2007    Priority: Low   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 Noemi Chapel.around 2000    Family History  Problem Relation Age of Onset   Diabetes Mother    Congestive Heart Failure Mother        passed at 60   Aortic aneurysm Mother    Hypertension Mother    Stroke  Mother        31   Breast cancer Father 57   Diabetes Brother    Thyroid cancer Brother    Other Brother        adopted   Colon cancer Cousin     Medications- reviewed and updated Current Outpatient Medications  Medication Sig Dispense Refill   aspirin EC 81 MG tablet Take 1 tablet (81 mg total) by mouth daily.     diazepam (VALIUM) 5 MG tablet TAKE 1 TABLET AS NEEDED ONCE DAILY FOR FLYING.do not drive for [id number] hours after use. 30 tablet 0   fish oil-omega-3 fatty acids 1000 MG capsule Take 2 g by mouth daily.     fluticasone (FLONASE) 50 MCG/ACT nasal spray Place 2 sprays into both nostrils daily. 16 g 3   ibuprofen (ADVIL,MOTRIN) 200 MG tablet Take 600-800 mg by mouth every 6 (six) hours as needed for moderate pain.     Multiple Vitamin (MULTIVITAMIN) tablet Take 1 tablet by mouth daily.     rosuvastatin (CRESTOR) 10 MG tablet Take 1 tablet (10 mg total) by mouth daily. 90 tablet 3   No current facility-administered medications for this visit.    Allergies-reviewed and updated No Known Allergies  Social History   Social History Narrative  Married 1984. 3 children- 29/28/27- in 2017. 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 Enjoys his work- 36 years in 2017.       Hobbies: golf, enjoys cigars      Objective  Objective:  BP 120/64   Pulse 71   Temp 98 F (36.7 C)   Ht 5\' 11"  (1.803 m)   Wt 223 lb 6.4 oz (101.3 kg)   SpO2 99%   BMI 31.16 kg/m  Gen: NAD, resting comfortably HEENT: Mucous membranes are moist. Oropharynx normal Neck: no thyromegaly CV: RRR no murmurs rubs or gallops Lungs: CTAB no crackles, wheeze, rhonchi Abdomen: soft/nontender/nondistended/normal bowel sounds. No rebound or guarding.  Ext: no edema Skin: warm, dry Neuro: grossly normal, moves all extremities, PERRLA   Assessment and Plan  67 y.o. male presenting for annual physical.  Health Maintenance counseling: 1.  Anticipatory guidance: Patient counseled regarding regular dental exams -q6 months, eye exams - no issues- advised check up- seen 2-3 years ago,  avoiding smoking and second hand smoke- see below , limiting alcohol to 2 beverages per day- 2-3 or less per week scotch, no illicit drugs.   2. Risk factor reduction:  Advised patient of need for regular exercise and diet rich and fruits and vegetables to reduce risk of heart attack and stroke.  Exercise- restarted this week- walking most days and starting back on golf, may do some yoga, encouraged to mix in some weights.  Diet/weight management-stable within 1 lb from last year- encouraged at least mild weight loss- feels could limit bad foods.  Wt Readings from Last 3 Encounters:  07/28/22 223 lb 6.4 oz (101.3 kg)  10/18/21 222 lb 3.2 oz (100.8 kg)  10/07/19 219 lb 6.4 oz (99.5 kg)  3. Immunizations/screenings/ancillary studies- last may 2022- he may wait until the newer vaccination updated in the fall Immunization History  Administered Date(s) Administered   Influenza Split 01/24/2011   Influenza Whole 03/18/2009, 03/24/2010   Influenza, High Dose Seasonal PF 02/19/2022   Influenza, Seasonal, Injecte, Preservative Fre 04/22/2012   Influenza,inj,Quad PF,6+ Mos 03/04/2013, 03/23/2015, 04/20/2016, 04/12/2017, 03/08/2018   Influenza-Unspecified 02/26/2014   PFIZER(Purple Top)SARS-COV-2 Vaccination 05/23/2019, 06/13/2019, 03/03/2020, 09/30/2020   Td 05/08/2002   Tdap 03/04/2013   Zoster Recombinat (Shingrix) 03/08/2018, 06/21/2018  4. Prostate cancer screening- low risk prior psa trend- update with labs . Declines rectal Lab Results  Component Value Date   PSA 1.28 10/18/2021   PSA 1.33 07/10/2019   PSA 1.06 03/08/2018   5. Colon cancer screening - 03/15/16 with 10 year repeat 6. Skin cancer screening- seeing dermatology yearly Dr. Wilhemina Bonito. advised regular sunscreen use. Denies worrisome, changing, or new skin lesions.  7. Smoking associated  screening (lung cancer screening, AAA screen 65-75, UA)- current smoker- aaa screen ordered 8. STD screening - only active with wife  Status of chronic or acute concerns   # Cramping --previously had noted cramps in both legs going on for a while reported that visit last June-particularly plays golf-mustard seem to be helpful and had also tried magnesium, bananas, Gatorade, pickle juice but not as effective. Usually back of his legs- played golf for first in a while and noted it signfiicantly- mustard still helpful. Some upper body stiffness- stiffness helped by pickle juice, bananas, magnesium though  #Left leg pain from June 2023- reports cleared up   #hyperlipidemia # Aortic atherosclerosis noted on CT chest July 2022  S: Medication:Rosuvastatin  10 mg daily, aspirin 81 mg, fish oil Lab Results  Component Value Date   CHOL 172 10/18/2021   HDL 45.50 10/18/2021   LDLCALC 92 07/10/2019   LDLDIRECT 111.0 10/18/2021   TRIG 272.0 (H) 10/18/2021   CHOLHDL 4 10/18/2021   A/P: update lipid panel- continue current medications for now- statin could contribute to cramping issue he is having but we prefer to stay on with aortic atherosclerosis- LDL has been above goal as well as triglycerides- prefer to push this down -with cramping we opted not to icnrease dose unless LDL worsens -on prior ct no coronary calcifications- did not think high yield to do ct calcium scoring- if notes calcifications in future he may want to sit down with Dr. Claiborne Billings   # Shaking in the morning-possible low blood sugar-encouraged trial of nighttime snack-no recent issues thankfully   #travel related anxiety-diazepam on hand as needed- refill today  # Lung nodule-5 mm 10/28/2021 and per guidelines no follow-up required with stability over 2 years-will make these decisions with radiation oncology- appears next already ordered  Recommended follow up: Return in about 1 year (around 07/28/2023) for physical or sooner if  needed.Schedule b4 you leave. Future Appointments  Date Time Provider [place]  11/22/2022 11:00 AM Bruning, Ashlyn, PA-C [facility] None   Lab/Order associations: fasting   ICD-10-CM   1. Preventative health care  Z00.00     2. Adjustment disorder with mixed anxiety and depressed mood  F43.23     3. Aortic atherosclerosis (HCC)  I70.0     4. Hyperlipidemia, mild  E78.5       No orders of the defined types were placed in this encounter.   Return precautions advised.  Garret Reddish, MD

## 2022-07-28 NOTE — Patient Instructions (Addendum)
Let us know if you get any other COVID vaccines.  Prevnar 20 today  Would love to see you quit cigars!   Please stop by lab before you go If you have mychart- we will send your results within 3 business days of [place] receiving them.  If you do not have mychart- we will call you about results within 5 business days of [place] receiving them.  *please also note that you will see labs on mychart as soon as they post. I will later go in and write notes on them- will say "notes from Dr. Yong Channel"   Recommended follow up: Return in about 1 year (around 07/28/2023) for physical or sooner if needed.Schedule b4 you leave.

## 2022-07-31 ENCOUNTER — Other Ambulatory Visit: Payer: Self-pay

## 2022-07-31 DIAGNOSIS — E875 Hyperkalemia: Secondary | ICD-10-CM

## 2022-09-06 DIAGNOSIS — M25561 Pain in right knee: Secondary | ICD-10-CM | POA: Diagnosis not present

## 2022-09-12 ENCOUNTER — Ambulatory Visit
Admission: RE | Admit: 2022-09-12 | Discharge: 2022-09-12 | Disposition: A | Discharge status: Home / Self Care | Payer: BC Managed Care – PPO | Source: Ambulatory Visit | Attending: Family Medicine | Admitting: Family Medicine

## 2022-09-12 ENCOUNTER — Other Ambulatory Visit: Payer: Self-pay | Admitting: Family Medicine

## 2022-09-12 DIAGNOSIS — Z136 Encounter for screening for cardiovascular disorders: Secondary | ICD-10-CM | POA: Diagnosis not present

## 2022-09-12 DIAGNOSIS — F17200 Nicotine dependence, unspecified, uncomplicated: Secondary | ICD-10-CM

## 2022-09-12 DIAGNOSIS — F4323 Adjustment disorder with mixed anxiety and depressed mood: Secondary | ICD-10-CM

## 2022-09-12 DIAGNOSIS — Z0000 Encounter for general adult medical examination without abnormal findings: Secondary | ICD-10-CM

## 2022-09-12 DIAGNOSIS — Z125 Encounter for screening for malignant neoplasm of prostate: Secondary | ICD-10-CM

## 2022-09-12 DIAGNOSIS — Z131 Encounter for screening for diabetes mellitus: Secondary | ICD-10-CM

## 2022-09-12 DIAGNOSIS — E669 Obesity, unspecified: Secondary | ICD-10-CM

## 2022-09-12 DIAGNOSIS — Z87891 Personal history of nicotine dependence: Secondary | ICD-10-CM | POA: Diagnosis not present

## 2022-09-12 DIAGNOSIS — E785 Hyperlipidemia, unspecified: Secondary | ICD-10-CM

## 2022-09-12 DIAGNOSIS — Z23 Encounter for immunization: Secondary | ICD-10-CM

## 2022-09-12 DIAGNOSIS — I700 Atherosclerosis of aorta: Secondary | ICD-10-CM

## 2022-11-20 ENCOUNTER — Ambulatory Visit (HOSPITAL_COMMUNITY)
Admission: RE | Admit: 2022-11-20 | Discharge: 2022-11-20 | Disposition: A | Discharge status: Home / Self Care | Payer: BC Managed Care – PPO | Source: Ambulatory Visit | Attending: Urology | Admitting: Urology

## 2022-11-20 ENCOUNTER — Encounter (HOSPITAL_COMMUNITY): Payer: Self-pay

## 2022-11-20 DIAGNOSIS — R918 Other nonspecific abnormal finding of lung field: Secondary | ICD-10-CM | POA: Insufficient documentation

## 2022-11-20 DIAGNOSIS — R911 Solitary pulmonary nodule: Secondary | ICD-10-CM | POA: Diagnosis not present

## 2022-11-20 DIAGNOSIS — I700 Atherosclerosis of aorta: Secondary | ICD-10-CM | POA: Diagnosis not present

## 2022-11-21 NOTE — Progress Notes (Signed)
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    Interval Since Last Radiation:  5 years  06/21/2017 - 08/01/2017:  Nose / 2Gy x 30 fractions  03/05/1995 to 04/04/1995: Nasal tip treated to 56.25 Gy in 25 fractions  Narrative: I spoke with the patient by telephone today to review the results from his recent CT chest that was performed on 11/20/22. The patient was notified in advance and gave permission to proceed with this visit format.  He had a CT Chest in 04/2019 for evaluation of persistent cough with his PCP.  This scan showed a nonspecific 5 mm subpleural nodule along the minor fissure. It was mentioned that a non-contrast chest CT could be considered in 12 months for patients considered to be high-risk. He does smoke cigars regularly and has remained highly concerned regarding the nodule on CT and preferred to continue with annual repeat imaging which fortunately has remained stable.     Interval history:   His recent CT chest that was performed on 11/20/22 revealed a continued stable appearance of the 5 mm perifissural right middle lobe pulmonary nodule that we have been following since December 2020.  Per radiology report, given that this nodule has demonstrated over 2 years of stability, it is favored as benign and no further follow-up is felt necessary. He remains asymptomatic and is currently without complaints.  He is a cigar smoker, and therefore interested in continuing in active surveillance with annual low dose CT Chest scans. He also continues in routine follow up with his dermatologist, Dr. Karlyn Agee for continued management for h/o recurrent basal cell carcinoma on his  nose.  On review of systems, the patient states that he is doing very well overall.  He is currently without complaints.  He specifically denies chest pain, shortness of breath, productive cough or hemoptysis.  He has continued playing golf regularly.  He denies recent fevers, chills or night sweats.  He reports a healthy appetite and is maintaining his weight. No recent dermatologic issues.  ALLERGIES:  has No Known Allergies.  Meds: Current Outpatient Medications  Medication Sig Dispense Refill   aspirin EC 81 MG tablet Take 1 tablet (81 mg total) by mouth daily.     diazepam (VALIUM) 5 MG tablet TAKE 1 TABLET AS NEEDED ONCE DAILY FOR FLYING.do not drive for 7-82 hours after use. 30 tablet 0   fish oil-omega-3 fatty acids 1000 MG capsule Take 2 g by mouth daily.     fluticasone (FLONASE) 50 MCG/ACT nasal spray Place 2 sprays into both nostrils daily. 16 g 3   ibuprofen (ADVIL,MOTRIN) 200 MG tablet Take 600-800 mg by mouth every 6 (six) hours as needed for moderate pain.     Multiple Vitamin (MULTIVITAMIN) tablet Take 1 tablet by mouth daily.     rosuvastatin (CRESTOR) 10 MG tablet Take 1 tablet (10 mg total) by mouth daily. 90 tablet 3   No current facility-administered medications for this visit.    Physical Findings:  vitals were not taken for this visit.   /Unable to assess due to telephone follow up visit format.  Lab Findings: Lab Results  Component Value Date  WBC 6.3 07/28/2022   HGB 16.3 07/28/2022   HCT 48.6 07/28/2022   MCV 97.9 07/28/2022   PLT 270.0 07/28/2022     Radiographic Findings: CT Chest Wo Contrast  Result Date: 11/21/2022 CLINICAL DATA:  Follow-up lung nodule EXAM: CT CHEST WITHOUT CONTRAST TECHNIQUE: Multidetector CT imaging of the chest was performed following the standard protocol without IV contrast. RADIATION DOSE REDUCTION: This exam was performed according to the departmental dose-optimization program which includes automated exposure control,  adjustment of the mA and/or kV according to patient size and/or use of iterative reconstruction technique. COMPARISON:  Chest CT dated October 28, 2021; chest CT dated April 10, 2019 FINDINGS: Cardiovascular: Normal heart size. No pericardial effusion. Normal caliber thoracic aorta with moderate calcified plaque. Mediastinum/Nodes: Esophagus and thyroid are unremarkable. No enlarged lymph nodes seen in the chest. Lungs/Pleura: Central airways are patent. Motion artifact somewhat limits evaluation of the lung parenchyma. Stable right middle lobe pulmonary nodule measuring 5 mm on series 8, image 102. No new pulmonary nodules. No consolidation, pleural effusion or pneumothorax. Upper Abdomen: No acute abnormality. Musculoskeletal: No chest wall mass or suspicious bone lesions identified. IMPRESSION: 1. Stable right middle lobe pulmonary nodule measuring 5 mm, considered benign given multi-year stability. 2. No new pulmonary nodules. 3. Aortic Atherosclerosis (ICD10-I70.0). Electronically Signed   By: Allegra Lai M.D.   On: 11/21/2022 09:05    Impression/Plan: 1. 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   He remains asymptomatic and is currently without complaints.  He is a cigar smoker, and therefore interested in continuing in active surveillance with annual low dose CT Chest scans. He is comfortable and in agreement with the stated plan.  He knows that he is welcome to call at anytime in the interim with any questions or concerns.   I personally spent 20 minutes in this encounter including chart review, reviewing radiological studies, telephone conversation with the patient, coordinating care and completing documentation.    Marguarite Arbour, PA-C    Margaretmary Dys, MD  [facility]  Radiation Oncology Direct Dial: [phone]  Fax: [phone] [email or web address]  Skype  [email or web address]

## 2022-11-22 ENCOUNTER — Encounter: Payer: Self-pay | Admitting: Urology

## 2022-11-22 ENCOUNTER — Ambulatory Visit
Admission: RE | Admit: 2022-11-22 | Discharge: 2022-11-22 | Disposition: A | Discharge status: Home / Self Care | Payer: BC Managed Care – PPO | Source: Ambulatory Visit | Attending: Urology | Admitting: Urology

## 2022-11-22 DIAGNOSIS — C44311 Basal cell carcinoma of skin of nose: Secondary | ICD-10-CM | POA: Diagnosis not present

## 2022-11-22 DIAGNOSIS — R911 Solitary pulmonary nodule: Secondary | ICD-10-CM

## 2022-11-22 DIAGNOSIS — F17200 Nicotine dependence, unspecified, uncomplicated: Secondary | ICD-10-CM

## 2022-11-22 NOTE — Progress Notes (Signed)
Telephone nursing appointment for patient to review most recent scan results from 11/21/2022. I verified patient's identity x2 and began nursing interview. Patient reports doing well. No issues conveyed at this time.   Meaningful use complete.   Patient aware of their 11:00am-11/22/2022 telephone appointment w/ Ashlyn Bruning PA-C. I left my extension [phone] in case patient needs anything. Patient verbalized understanding. This concludes the nursing interview.   Patient contact [phone]     Ruel Favors, LPN

## 2022-12-19 DIAGNOSIS — D225 Melanocytic nevi of trunk: Secondary | ICD-10-CM | POA: Diagnosis not present

## 2022-12-19 DIAGNOSIS — L570 Actinic keratosis: Secondary | ICD-10-CM | POA: Diagnosis not present

## 2022-12-19 DIAGNOSIS — L821 Other seborrheic keratosis: Secondary | ICD-10-CM | POA: Diagnosis not present

## 2022-12-19 DIAGNOSIS — C44519 Basal cell carcinoma of skin of other part of trunk: Secondary | ICD-10-CM | POA: Diagnosis not present

## 2022-12-19 DIAGNOSIS — C44619 Basal cell carcinoma of skin of left upper limb, including shoulder: Secondary | ICD-10-CM | POA: Diagnosis not present

## 2022-12-19 DIAGNOSIS — D485 Neoplasm of uncertain behavior of skin: Secondary | ICD-10-CM | POA: Diagnosis not present

## 2022-12-19 DIAGNOSIS — Z85828 Personal history of other malignant neoplasm of skin: Secondary | ICD-10-CM | POA: Diagnosis not present

## 2022-12-19 DIAGNOSIS — L820 Inflamed seborrheic keratosis: Secondary | ICD-10-CM | POA: Diagnosis not present

## 2022-12-19 DIAGNOSIS — D2261 Melanocytic nevi of right upper limb, including shoulder: Secondary | ICD-10-CM | POA: Diagnosis not present

## 2023-05-24 DIAGNOSIS — M5412 Radiculopathy, cervical region: Secondary | ICD-10-CM | POA: Diagnosis not present

## 2023-06-25 ENCOUNTER — Encounter: Payer: Self-pay | Admitting: Family Medicine

## 2023-06-25 ENCOUNTER — Ambulatory Visit: Payer: BC Managed Care – PPO | Admitting: Family Medicine

## 2023-06-25 VITALS — BP 120/62 | HR 69 | Temp 97.0°F | Ht 71.0 in | Wt 211.8 lb

## 2023-06-25 DIAGNOSIS — R0981 Nasal congestion: Secondary | ICD-10-CM

## 2023-06-25 LAB — POCT INFLUENZA A/B
Influenza A, POC: NEGATIVE
Influenza B, POC: NEGATIVE

## 2023-06-25 LAB — POC COVID19 BINAXNOW: SARS Coronavirus 2 Ag: POSITIVE — AB

## 2023-06-25 NOTE — Progress Notes (Signed)
Phone [phone] In person visit   Subjective:   Dakota Barker is a 68 y.o. year old very pleasant male patient who presents for/with See problem oriented charting Chief Complaint  Patient presents with   Headache    Pt c/o sinus headache along with nasal congestion that started Friday.   Past Medical History-  Patient Active Problem List   Diagnosis Date Noted   Incidental lung nodule, > 3mm and < 8mm 10/09/2019    Priority: High   Tobacco use disorder 02/23/2009    Priority: High   Aortic atherosclerosis (HCC) 10/18/2021    Priority: Medium    Benign paroxysmal positional vertigo 07/10/2019    Priority: Medium    Anxiety state 01/03/2016    Priority: Medium    Palpitations 06/05/2013    Priority: Medium    Hyperlipidemia, mild 12/10/2007    Priority: Medium    Benign essential tremor 12/10/2007    Priority: Medium    Reactive airway disease 08/03/2015    Priority: Low   Erectile dysfunction 03/05/2014    Priority: Low   Microscopic hematuria 12/10/2007    Priority: Low   Allergic rhinitis 01/04/2007    Priority: Low   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     diazepam (VALIUM) 5 MG tablet TAKE 1 TABLET AS NEEDED ONCE DAILY FOR FLYING.do not drive for 0-98 hours after use. 30 tablet 0   fish oil-omega-3 fatty acids 1000 MG capsule Take 2 g by mouth daily.     fluticasone (FLONASE) 50 MCG/ACT nasal spray Place 2 sprays into both nostrils daily. 16 g 3   ibuprofen (ADVIL,MOTRIN) 200 MG tablet Take 600-800 mg by mouth every 6 (six) hours as needed for moderate pain.     Multiple Vitamin (MULTIVITAMIN) tablet Take 1 tablet by mouth daily.     rosuvastatin (CRESTOR) 10 MG tablet Take 1 tablet (10 mg total) by mouth daily. 90 tablet 3   No current facility-administered medications  for this visit.     Objective:  BP 120/62   Pulse 69   Temp (!) 97 F (36.1 C)   Ht 5\' 11"  (1.803 m)   Wt 211 lb 12.8 oz (96.1 kg)   SpO2 96%   BMI 29.54 kg/m  Gen: NAD, resting comfortably Nasal turbinates erythematous and edematous with clear discharge, tympanic membranes normal bilaterally, pharynx mildly erythematous but mucous membranes moist CV: RRR no murmurs rubs or gallops Lungs: CTAB no crackles, wheeze, rhonchi Abdomen: soft/nontender/nondistended/normal bowel sounds. No rebound or guarding.  Ext: no edema Skin: warm, dry     Assessment and Plan   # Covid 19 S:symptoms started on Friday include sinus congestion, sinus headache. No fever. No shortness of breath. Occasional slight sharp chest pain. Tylenol for headache(s) but not in last 24 hours and has tried alk seltzer cold. Taking vitamin C  A/P: Patient with testing confirming covid 19 with first day of covid 19 symptoms fever 14th Vaccination status: Last COVID-vaccine in 2022  Therefore: - recommended patient watch closely for shortness of breath or confusion or worsening symptoms and if those occur patient should contact us immediately or seek care in the emergency department -recommended patient consider purchasing pulse oximeter and if levels 94% or below persistently- seek care at the hospital (very unlikely for this to  happen without significant underlying respiratory disease) - Isolation guidelines per CDC as of march 2024- "The recommendations suggest returning to normal activities when, for at least 24 hours, symptoms are improving overall, and if a fever was present, it has been gone without use of a fever-reducing medication. Once people resume normal activities, they are encouraged to take additional prevention strategies for the next 5 days to curb disease spread, such as taking more steps for cleaner air, enhancing hygiene practices, wearing a well-fitting mask, keeping a distance from others, and/or  getting tested for respiratory viruses." -Patient should inform close contacts about exposure (anyone patient been around unmasked for more than 15 minutes)    If High risk for complications-we discussed outpatient therapeutic options including paxlovid (and risk of rebound)- hed prefer to hold off on this in particular since hed have to hold statin.  - declines cough medicine  Recommended follow up: Return for as needed for new, worsening, persistent symptoms. Future Appointments  Date Time Provider Department Center  10/24/2023 11:00 AM Bruning, Ashlyn, PA-C [facility] None  01/18/2024  9:00 AM Shelva Majestic, MD [facility]    Lab/Order associations:   ICD-10-CM   1. Nasal congestion  R09.81 POC COVID-19    POCT Influenza A/B      No orders of the defined types were placed in this encounter.   Return precautions advised.  Tana Conch, MD

## 2023-06-25 NOTE — Patient Instructions (Addendum)
Positive COVID test today- stay well hydrated and get plenty of rest- keep up the vitamin C  Opted out of Paxlovid- if you change your mind- we have until Wednesday to send this in.   Recommended follow up: Return for as needed for new, worsening, persistent symptoms.

## 2023-07-03 ENCOUNTER — Other Ambulatory Visit: Payer: Self-pay | Admitting: Family Medicine

## 2023-07-12 ENCOUNTER — Encounter: Payer: Self-pay | Admitting: Family Medicine

## 2023-07-12 ENCOUNTER — Ambulatory Visit: Admitting: Family Medicine

## 2023-07-12 VITALS — BP 100/64 | HR 92 | Temp 97.2°F | Ht 71.0 in | Wt 215.2 lb

## 2023-07-12 DIAGNOSIS — H6591 Unspecified nonsuppurative otitis media, right ear: Secondary | ICD-10-CM

## 2023-07-12 DIAGNOSIS — J329 Chronic sinusitis, unspecified: Secondary | ICD-10-CM

## 2023-07-12 DIAGNOSIS — F411 Generalized anxiety disorder: Secondary | ICD-10-CM

## 2023-07-12 DIAGNOSIS — B9689 Other specified bacterial agents as the cause of diseases classified elsewhere: Secondary | ICD-10-CM | POA: Diagnosis not present

## 2023-07-12 MED ORDER — DOXYCYCLINE HYCLATE 100 MG PO TABS: 100.0000 mg | ORAL_TABLET | Freq: Two times a day (BID) | ORAL | 0 refills | Status: AC

## 2023-07-12 MED ORDER — DIAZEPAM 5 MG PO TABS: ORAL_TABLET | ORAL | 0 refills | Status: DC

## 2023-07-12 MED ORDER — FLUTICASONE PROPIONATE 50 MCG/ACT NA SUSP: 2.0000 | Freq: Every day | NASAL | 3 refills | Status: DC

## 2023-07-12 NOTE — Patient Instructions (Addendum)
 patient with COVID 19 starting mid February and has had improvement in most symptoms other than lingering cough and some nasal congestion- on exam he has some maxillary sinus tenderness on the right side and appears to have cloudy membrane on right suggestive of otitis media with effusion (told him if that doesn't get better try Flonase for a month and if not better at that point refer to ENT). I am concerned for bacterial sinusitis superinfection after COVID 19- this will treat that and would also give walking pneumonia coverage(though I do not hear an obvious pneumonia).. If he fails to improve or worsens asked that he see [place] back in office to relisten  Recommended follow up: Return for as needed for new, worsening, persistent symptoms.

## 2023-07-12 NOTE — Progress Notes (Signed)
 Phone [phone] In person visit   Subjective:   Dakota Barker is a 68 y.o. year old very pleasant male patient who presents for/with See problem oriented charting Chief Complaint  Patient presents with   Cough    Pt c/o lingering cough due to covid the 14th of Feb.    Past Medical History-  Patient Active Problem List   Diagnosis Date Noted   Incidental lung nodule, > 3mm and < 8mm 10/09/2019    Priority: High   Tobacco use disorder 02/23/2009    Priority: High   Aortic atherosclerosis (HCC) 10/18/2021    Priority: Medium    Benign paroxysmal positional vertigo 07/10/2019    Priority: Medium    Anxiety state 01/03/2016    Priority: Medium    Palpitations 06/05/2013    Priority: Medium    Hyperlipidemia, mild 12/10/2007    Priority: Medium    Benign essential tremor 12/10/2007    Priority: Medium    Reactive airway disease 08/03/2015    Priority: Low   Erectile dysfunction 03/05/2014    Priority: Low   Microscopic hematuria 12/10/2007    Priority: Low   Allergic rhinitis 01/04/2007    Priority: Low   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     doxycycline (VIBRA-TABS) 100 MG tablet Take 1 tablet (100 mg total) by mouth 2 (two) times daily for 7 days. 14 tablet 0   fish oil-omega-3 fatty acids 1000 MG capsule Take 2 g by mouth daily.     ibuprofen (ADVIL,MOTRIN) 200 MG tablet Take 600-800 mg by mouth every 6 (six) hours as needed for moderate pain.     Multiple Vitamin (MULTIVITAMIN) tablet Take 1 tablet by mouth daily.     rosuvastatin (CRESTOR) 10 MG tablet TAKE ONE TABLET BY MOUTH DAILY 90 tablet 3   diazepam (VALIUM) 5 MG tablet TAKE 1 TABLET AS NEEDED ONCE DAILY FOR FLYING.do not drive for 4-13 hours after use. 30 tablet 0   fluticasone (FLONASE) 50 MCG/ACT nasal spray Place  2 sprays into both nostrils daily. 16 g 3   No current facility-administered medications for this visit.     Objective:  BP 100/64   Pulse 92   Temp (!) 97.2 F (36.2 C)   Ht 5\' 11"  (1.803 m)   Wt 215 lb 3.2 oz (97.6 kg)   SpO2 96%   BMI 30.01 kg/m  Gen: NAD, resting comfortably Tympanic membrane normal on left, on right cloudiness noted and appears to have air fluid level, nasal turbinates edematous with yellow discharge, tender right maxillary sinus CV: RRR no murmurs rubs or gallops Lungs: CTAB no crackles, wheeze, rhonchi.  Ext: no edema Skin: warm, dry     Assessment and Plan   # Covid 19 recently with lingering congestion and cough S:patient with COVID 19 in mid February. He has had a lingering cough since that time.  Tested positive on 06/25/23 here in office. He opted out of Paxlovid at that time - no significant shortness of breath (maybe slightly winded with normal walking route with dog- about 10 minutes)  just notes intermittent cough- productive in am and then gets dryer as day goes on by mid morning. No fever. Nasal congestion in the morning in particular. Slightly feels equilibrium off. Prior headaches resolved. Still some fatigue -  right ear has felt congested longer than this perhaps a few months A/P: patient with COVID 19 starting mid February and has had improvement in most symptoms other than lingering cough and some nasal congestion- on exam he has some maxillary sinus tenderness on the right side and appears to have cloudy membrane on right suggestive of otitis media with effusion (told him if that doesn't get better try Flonase for a month and if not better at that point refer to ENT). I am concerned for bacterial sinusitis superinfection after COVID 19- this will treat that and would also give walking pneumonia coverage (though I do not hear an obvious pneumonia). If he fails to improve or worsens asked that he see [place] back in office to re listen  # anxiety state-  primarily travel related S:long term issues with flying in particular. Needs window seat. He has some upcoming travel and requests refill of valium  A/P: good control with current prescription- jus tneeds refill- sent in today     Recommended follow up: Return for as needed for new, worsening, persistent symptoms. Future Appointments  Date Time Provider Department Center  10/24/2023 11:00 AM Marcello Fennel, PA-C [facility] None  01/18/2024  9:00 AM Durene Cal Aldine Contes, MD [facility]    Lab/Order associations:   ICD-10-CM   1. Anxiety state  F41.1 diazepam (VALIUM) 5 MG tablet    2. Bacterial sinusitis  J32.9    B96.89     3. Right otitis media with effusion  H65.91       Meds ordered this encounter  Medications   doxycycline (VIBRA-TABS) 100 MG tablet    Sig: Take 1 tablet (100 mg total) by mouth 2 (two) times daily for 7 days.    Dispense:  14 tablet    Refill:  0   fluticasone (FLONASE) 50 MCG/ACT nasal spray    Sig: Place 2 sprays into both nostrils daily.    Dispense:  16 g    Refill:  3   diazepam (VALIUM) 5 MG tablet    Sig: TAKE 1 TABLET AS NEEDED ONCE DAILY FOR FLYING.do not drive for 7-84 hours after use.    Dispense:  30 tablet    Refill:  0    Return precautions advised.  Tana Conch, MD

## 2023-07-31 ENCOUNTER — Telehealth: Payer: Self-pay

## 2023-07-31 DIAGNOSIS — H6591 Unspecified nonsuppurative otitis media, right ear: Secondary | ICD-10-CM

## 2023-07-31 NOTE — Telephone Encounter (Signed)
 Copied from CRM [id number]. Topic: Referral - Request for Referral >> Jul 30, 2023  4:08 PM Almira Coaster wrote: Did the patient discuss referral with their provider in the last year? Yes (If No - schedule appointment) (If Yes - send message)  Appointment offered? No  Type of order/referral and detailed reason for visit: ENT   Preference of office, provider, location: [facility] [address] [place] [address]   If referral order, have you been seen by this specialty before? No (If Yes, this issue or another issue? When? Where?  Can we respond through MyChart? Yes   Per patient call treatment with Flonase etc is not helping and requesting referral to ENT. Ok to place referral?

## 2023-07-31 NOTE — Telephone Encounter (Signed)
 May refer to ENT that he requested under otitis media with effusion

## 2023-08-01 NOTE — Telephone Encounter (Signed)
 Referral to ENT has been placed

## 2023-08-01 NOTE — Addendum Note (Signed)
 Addended by: Gwenette Greet on: 08/01/2023 10:24 AM   Modules accepted: Orders

## 2023-08-03 DIAGNOSIS — M25561 Pain in right knee: Secondary | ICD-10-CM | POA: Diagnosis not present

## 2023-10-16 ENCOUNTER — Ambulatory Visit (HOSPITAL_COMMUNITY)
Admission: RE | Admit: 2023-10-16 | Discharge: 2023-10-16 | Disposition: A | Discharge status: Home / Self Care | Source: Ambulatory Visit | Attending: Urology | Admitting: Urology

## 2023-10-16 DIAGNOSIS — F17200 Nicotine dependence, unspecified, uncomplicated: Secondary | ICD-10-CM | POA: Diagnosis not present

## 2023-10-16 DIAGNOSIS — I700 Atherosclerosis of aorta: Secondary | ICD-10-CM | POA: Diagnosis not present

## 2023-10-16 DIAGNOSIS — R918 Other nonspecific abnormal finding of lung field: Secondary | ICD-10-CM | POA: Diagnosis not present

## 2023-10-16 DIAGNOSIS — R911 Solitary pulmonary nodule: Secondary | ICD-10-CM | POA: Diagnosis not present

## 2023-10-17 ENCOUNTER — Telehealth: Payer: Self-pay | Admitting: *Deleted

## 2023-10-17 NOTE — Telephone Encounter (Signed)
 RETURNED PATIENT'S PHONE CALL, SPOKE WITH PATIENT. ?

## 2023-10-18 ENCOUNTER — Institutional Professional Consult (permissible substitution) (INDEPENDENT_AMBULATORY_CARE_PROVIDER_SITE_OTHER): Admitting: Otolaryngology

## 2023-10-19 ENCOUNTER — Telehealth: Payer: Self-pay

## 2023-10-19 NOTE — Telephone Encounter (Signed)
 RN called patient to inform him of telephone appointment next Wed. 10/24/2023, with Ashlyn to go over CT scan results.  Dakota Barker was appreciative of the call.

## 2023-10-21 ENCOUNTER — Encounter: Payer: Self-pay | Admitting: Family Medicine

## 2023-10-23 ENCOUNTER — Encounter: Payer: Self-pay | Admitting: Urology

## 2023-10-23 NOTE — Progress Notes (Signed)
 Telephone nursing appointment for review of most recent CT-Chest results. I verified patient's identity x2 and began nursing interview.   Patient states issues as follows...  -Pain: Denies -Fatigue: Denies -Skin: Denies -Lungs: Denies -Appetite: Good   Patient denies any other related issues at this time.   Meaningful use complete.   Patient aware of their telephone appointment w/ Ashlyn Bruning PA-C. I left my extension [phone] in case patient needs anything. Patient verbalized understanding. This concludes the nursing interview.   Patient preferred phone # [phone]   Avery Bodo, LPN

## 2023-10-24 ENCOUNTER — Ambulatory Visit
Admission: RE | Admit: 2023-10-24 | Discharge: 2023-10-24 | Disposition: A | Discharge status: Home / Self Care | Payer: BC Managed Care – PPO | Source: Ambulatory Visit | Attending: Urology | Admitting: Urology

## 2023-10-24 VITALS — Ht 71.0 in | Wt 213.0 lb

## 2023-10-24 DIAGNOSIS — C44311 Basal cell carcinoma of skin of nose: Secondary | ICD-10-CM | POA: Diagnosis not present

## 2023-10-24 DIAGNOSIS — F17200 Nicotine dependence, unspecified, uncomplicated: Secondary | ICD-10-CM

## 2023-10-24 DIAGNOSIS — R911 Solitary pulmonary nodule: Secondary | ICD-10-CM

## 2023-10-24 NOTE — Progress Notes (Signed)
 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    Interval Since Last Radiation:  6 years  06/21/2017 - 08/01/2017:  Nose / 2Gy x 30 fractions  03/05/1995 to 04/04/1995: Nasal tip treated to 56.25 Gy in 25 fractions  Narrative: I spoke with the patient by telephone today to review the results from his recent CT chest that was performed on 10/16/23. The patient was notified in advance and gave permission to proceed with this visit format.  He had a CT Chest in 04/2019 for evaluation of persistent cough with his PCP.  This scan showed a nonspecific 5 mm subpleural nodule along the minor fissure. It was mentioned that a non-contrast chest CT could be considered in 12 months for patients considered to be high-risk. He does smoke cigars regularly and has remained highly concerned regarding the nodule on CT, given his family history of lung cancer in his mother, and preferred to continue with annual repeat imaging which fortunately has remained stable.     Interval history:   His recent CT chest that was performed on 10/16/23 reveals a continued stable appearance of the 5 mm perifissural right middle lobe pulmonary nodule that we have been following since December 2020.  Per radiology report, given that this nodule has demonstrated over 2 years of stability, it is favored as benign and no further follow-up is felt necessary. He remains asymptomatic and is currently without complaints.  He is a cigar smoker with a family history of lung cancer in his mother, and therefore interested in continuing in active surveillance with annual low dose CT Chest scans. He also continues in routine follow up with his  dermatologist, Dr. Marcia Setters for continued management for h/o recurrent basal cell carcinoma on his nose.  On review of systems, the patient states that he is doing very well overall.  He is currently without complaints.  He specifically denies chest pain, shortness of breath, productive cough or hemoptysis.  He has continued playing golf regularly.  He denies recent fevers, chills or night sweats.  He reports a healthy appetite and is maintaining his weight. No recent dermatologic issues.  ALLERGIES:  has no known allergies.  Meds: Current Outpatient Medications  Medication Sig Dispense Refill   aspirin EC 81 MG tablet Take 1 tablet (81 mg total) by mouth daily.     diazepam  (VALIUM ) 5 MG tablet TAKE 1 TABLET AS NEEDED ONCE DAILY FOR FLYING.do not drive for 4-09 hours after use. 30 tablet 0   fish oil-omega-3 fatty acids 1000 MG capsule Take 2 g by mouth daily.     fluticasone  (FLONASE ) 50 MCG/ACT nasal spray Place 2 sprays into both nostrils daily. 16 g 3   ibuprofen  (ADVIL ,MOTRIN ) 200 MG tablet Take 600-800 mg by mouth every 6 (six) hours as needed for moderate pain.     Multiple Vitamin (MULTIVITAMIN) tablet Take 1 tablet by mouth daily.     rosuvastatin  (CRESTOR ) 10 MG tablet TAKE ONE TABLET BY MOUTH DAILY 90 tablet 3   No current facility-administered medications for this encounter.    Physical Findings:  height is 5' 11 (1.803 m) and weight is 213 lb (96.6 kg).  Pain Assessment Pain Score: 0-No pain/ Unable to assess due to telephone follow up visit format.  Lab Findings: Lab Results  Component Value Date   WBC 6.3 07/28/2022   HGB 16.3 07/28/2022   HCT 48.6 07/28/2022   MCV 97.9 07/28/2022   PLT 270.0 07/28/2022     Radiographic Findings: CT Chest Wo Contrast Result Date: 10/21/2023 CLINICAL DATA:  History of basal cell carcinoma of the nose sent to follow-up 5 mm pulmonary nodule. EXAM: CT CHEST WITHOUT CONTRAST TECHNIQUE: Multidetector CT imaging of the chest was  performed following the standard protocol without IV contrast. RADIATION DOSE REDUCTION: This exam was performed according to the departmental dose-optimization program which includes automated exposure control, adjustment of the mA and/or kV according to patient size and/or use of iterative reconstruction technique. COMPARISON:  November 20, 2022, November 29, 2020 and April 10, 2019 FINDINGS: Cardiovascular: There is mild calcification of the aortic arch. Normal heart size. No pericardial effusion. Mediastinum/Nodes: No enlarged mediastinal or axillary lymph nodes. Thyroid  gland, trachea, and esophagus demonstrate no significant findings. Lungs/Pleura: A stable 5 mm right middle lobe pulmonary nodule is seen (axial CT image 85, CT series 5) with stability documented as far back as April 10, 2019. No new pulmonary nodules are identified. There is mild biapical scarring and/or atelectasis. No acute infiltrate, pleural effusion or pneumothorax is identified. Upper Abdomen: No acute abnormality. Musculoskeletal: No chest wall mass or suspicious bone lesions identified. IMPRESSION: 1. Stable, likely benign 5 mm right middle lobe pulmonary nodule. 2. Mild biapical scarring and/or atelectasis. 3. Aortic atherosclerosis. Electronically Signed   By: Virgle Grime M.D.   On: 10/21/2023 01:36    Impression/Plan: 1. 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   He remains asymptomatic and is currently without complaints.  He is a cigar smoker, and therefore interested in continuing in active surveillance with annual low dose CT Chest scans. He is comfortable and in agreement with the stated plan.  He knows that he is welcome to call at anytime in the interim with any questions or concerns.   I personally spent 20 minutes in this encounter including chart review, reviewing radiological studies, telephone conversation with the patient, coordinating care and  completing documentation.    Arta Bihari, PA-C    Kenith Payer, MD  [facility]  Radiation Oncology Direct Dial: [phone]  Fax: [phone] [email or web address]  Skype  [email or web address]

## 2023-10-24 NOTE — Addendum Note (Signed)
 Encounter addended by: Keitha Pata, PA-C on: 10/24/2023 11:12 AM  Actions taken: Level of Service modified, Clinical Note Signed

## 2023-11-01 ENCOUNTER — Encounter (INDEPENDENT_AMBULATORY_CARE_PROVIDER_SITE_OTHER): Payer: Self-pay | Admitting: Otolaryngology

## 2023-11-01 ENCOUNTER — Ambulatory Visit (INDEPENDENT_AMBULATORY_CARE_PROVIDER_SITE_OTHER): Admitting: Otolaryngology

## 2023-11-01 VITALS — BP 132/73 | HR 63

## 2023-11-01 DIAGNOSIS — H9191 Unspecified hearing loss, right ear: Secondary | ICD-10-CM

## 2023-11-01 DIAGNOSIS — J3089 Other allergic rhinitis: Secondary | ICD-10-CM

## 2023-11-01 DIAGNOSIS — R0981 Nasal congestion: Secondary | ICD-10-CM

## 2023-11-01 DIAGNOSIS — R0982 Postnasal drip: Secondary | ICD-10-CM

## 2023-11-01 MED ORDER — FLUTICASONE PROPIONATE 50 MCG/ACT NA SUSP
2.0000 | Freq: Every day | NASAL | 6 refills | Status: DC
Start: 2023-11-01 — End: 2024-01-28

## 2023-11-01 MED ORDER — LEVOCETIRIZINE DIHYDROCHLORIDE 5 MG PO TABS: 5.0000 mg | ORAL_TABLET | Freq: Every evening | ORAL | 3 refills | Status: DC

## 2023-11-01 NOTE — Progress Notes (Signed)
 ENT CONSULT:  Reason for Consult: right ear muffled hearing and pressure    HPI: Discussed the use of AI scribe software for clinical note transcription with the patient, who gave verbal consent to proceed.  History of Present Illness Dakota Barker is a 68 year old male who presents with right ear clogging and hearing issues. He was referred by Dr. Katrinka.  He experiences a sensation of a clogged right ear, recurring annually around October and November for several years, affecting his hearing on the right side with decreased auditory acuity. Occasionally, he can relieve the clogging with a Valsalva maneuver, but it tends to recur shortly after.  He has previously been prescribed steroids for this issue but cannot tolerate them due to side effects such as jitteriness and weight gain. Nasal sprays have also been used without relief. He has spring allergies but has recently stopped treating them.  No ear pain or tinnitus. He describes the sensation as 'clouded up' rather than painful. He has not had a recent hearing test.   Records Reviewed:  06/25/23 Dr Katrinka visit  # Covid 19 S:symptoms started on Friday include sinus congestion, sinus headache. No fever. No shortness of breath. Occasional slight sharp chest pain. Tylenol  for headache(s) but not in last 24 hours and has tried alk seltzer cold. Taking vitamin C  A/P: Patient with testing confirming covid 19 with first day of covid 19 symptoms fever 14th Vaccination status: Last COVID-vaccine in 2022     Past Medical History:  Diagnosis Date   ADHD (attention deficit hyperactivity disorder)    no treatment   Allergy    seasonal   Hematuria    Hyperlipidemia    Skin cancer    nose- radiation only. Cared for regularly Dr. Rolan molt.   Tremor    benign, essential    Past Surgical History:  Procedure Laterality Date   ACDF for cervidal spondylitic myleopathy   10/27/2019   KNEE SURGERY  1979   right   left heel surgery      Dr. Jane.around 2000    Family History  Problem Relation Age of Onset   Diabetes Mother    Congestive Heart Failure Mother        passed at 28   Aortic aneurysm Mother    Hypertension Mother    Stroke Mother        68   Breast cancer Father 62   Diabetes Brother    Thyroid  cancer Brother    Other Brother        adopted   Colon cancer Cousin     Social History:  reports that he has been smoking cigars. He has never used smokeless tobacco. He reports current alcohol use of about 2.0 standard drinks of alcohol per week. He reports that he does not use drugs.  Allergies: No Known Allergies  Medications: I have reviewed the patient's current medications.  The PMH, PSH, Medications, Allergies, and SH were reviewed and updated.  ROS: Constitutional: Negative for fever, weight loss and weight gain. Cardiovascular: Negative for chest pain and dyspnea on exertion. Respiratory: Is not experiencing shortness of breath at rest. Gastrointestinal: Negative for nausea and vomiting. Neurological: Negative for headaches. Psychiatric: The patient is not nervous/anxious  Blood pressure 132/73, pulse 63, SpO2 92%.  PHYSICAL EXAM:  Exam: General: Well-developed, well-nourished Respiratory Respiratory effort: Equal inspiration and expiration without stridor Cardiovascular Peripheral Vascular: Warm extremities with equal color/perfusion Eyes: No nystagmus with equal extraocular motion bilaterally Neuro/Psych/Balance: Patient  oriented to person, place, and time; Appropriate mood and affect; Gait is intact with no imbalance; Cranial nerves I-XII are intact Head and Face Inspection: Normocephalic and atraumatic without mass or lesion Palpation: Facial skeleton intact without bony stepoffs Salivary Glands: No mass or tenderness Facial Strength: Facial motility symmetric and full bilaterally ENT Pinna: External ear intact and fully developed External canal: Canal is patent with intact  skin Tympanic Membrane: Clear and mobile External Nose: No scar or anatomic deformity Internal Nose: Septum intact and midline. No edema, polyp, or rhinorrhea Lips, Teeth, and gums: Mucosa and teeth intact and viable TMJ: No pain to palpation with full mobility Oral cavity/oropharynx: No erythema or exudate, no lesions present Neck Neck and Trachea: Midline trachea without mass or lesion Thyroid : No mass or nodularity Lymphatics: No lymphadenopathy  We attempted nasal endoscopy but he did not tolerate it and his nasal passages were narrow   Assessment/Plan: Encounter Diagnoses  Name Primary?   Decreased hearing, right Yes   Environmental and seasonal allergies    Chronic nasal congestion    Post-nasal drip     Assessment and Plan Assessment & Plan Decreased hearing ear pressure right side Eustachian tube dysfunction Hx of seasonal allergies   Suspect chronic right-sided eustachian tube dysfunction with ear clogging and decreased hearing. Symptoms relieved by Valsalva. Steroids not tolerated well. Nasal sprays and Zyrtec ineffective. Normal ear exam today. Did not tolerate nasal endoscopy Differential includes eustachian tube dysfunction vs hearing loss. - Order hearing test and tympanogram. - Prescribe Xyzal for allergies. Stop Zyrtec - Prescribe Flonase  nasal spray. - RTC after hearing test    Thank you for allowing me to participate in the care of this patient. Please do not hesitate to contact me with any questions or concerns.   Elena Larry, MD Otolaryngology [facility] Phone: [phone] Fax: [phone]    11/01/2023, 8:47 AM

## 2023-12-19 DIAGNOSIS — L080 Pyoderma: Secondary | ICD-10-CM | POA: Diagnosis not present

## 2023-12-20 ENCOUNTER — Ambulatory Visit (INDEPENDENT_AMBULATORY_CARE_PROVIDER_SITE_OTHER): Admitting: Audiology

## 2023-12-20 ENCOUNTER — Encounter (INDEPENDENT_AMBULATORY_CARE_PROVIDER_SITE_OTHER): Payer: Self-pay | Admitting: Otolaryngology

## 2023-12-20 ENCOUNTER — Ambulatory Visit (INDEPENDENT_AMBULATORY_CARE_PROVIDER_SITE_OTHER): Admitting: Otolaryngology

## 2023-12-20 VITALS — BP 131/81 | HR 61

## 2023-12-20 DIAGNOSIS — H9011 Conductive hearing loss, unilateral, right ear, with unrestricted hearing on the contralateral side: Secondary | ICD-10-CM | POA: Diagnosis not present

## 2023-12-20 DIAGNOSIS — H6991 Unspecified Eustachian tube disorder, right ear: Secondary | ICD-10-CM

## 2023-12-20 DIAGNOSIS — H6531 Chronic mucoid otitis media, right ear: Secondary | ICD-10-CM

## 2023-12-20 DIAGNOSIS — J3089 Other allergic rhinitis: Secondary | ICD-10-CM

## 2023-12-20 DIAGNOSIS — H9191 Unspecified hearing loss, right ear: Secondary | ICD-10-CM

## 2023-12-20 DIAGNOSIS — H9042 Sensorineural hearing loss, unilateral, left ear, with unrestricted hearing on the contralateral side: Secondary | ICD-10-CM | POA: Diagnosis not present

## 2023-12-20 DIAGNOSIS — H6691 Otitis media, unspecified, right ear: Secondary | ICD-10-CM | POA: Diagnosis not present

## 2023-12-20 DIAGNOSIS — H90A11 Conductive hearing loss, unilateral, right ear with restricted hearing on the contralateral side: Secondary | ICD-10-CM

## 2023-12-20 DIAGNOSIS — R0981 Nasal congestion: Secondary | ICD-10-CM

## 2023-12-20 DIAGNOSIS — F17290 Nicotine dependence, other tobacco product, uncomplicated: Secondary | ICD-10-CM | POA: Diagnosis not present

## 2023-12-20 DIAGNOSIS — H6990 Unspecified Eustachian tube disorder, unspecified ear: Secondary | ICD-10-CM | POA: Diagnosis not present

## 2023-12-20 DIAGNOSIS — R0982 Postnasal drip: Secondary | ICD-10-CM

## 2023-12-20 NOTE — Progress Notes (Signed)
  [address], [address] [place], [place] [address] [phone]  Audiological Evaluation    Name: Dakota Barker     DOB:   Jan 13, 1956      MRN:   [id number]                                                                                     Service Date: 12/20/2023     Accompanied by: unaccompanied   Patient comes today after Dr. Soldatova, ENT sent a referral for a hearing evaluation due to concerns with hearing loss.   Symptoms Yes Details  Hearing loss  [x]  Right ear  Tinnitus  []    Ear pain/ infections/pressure  [x]  Right ear feels clogged, and with Valsalva he feels his ear pops and hears better.  Balance problems  []    Noise exposure history  []    Previous ear surgeries  []    Family history of hearing loss  []    Amplification  []    Other  []      Otoscopy: Right ear: Clear external ear canal and visible tympanic membrane. Left ear:  Clear external ear canal and visible tympanic membrane.  Tympanometry: Right ear: Type B- Normal external ear canal volume with no middle ear pressure peak or tympanic membrane compliance. Left ear: Type A- Normal external ear canal volume with normal middle ear pressure and tympanic membrane compliance.  Pure tone Audiometry: Right ear- Borderline normal to severe essentially conductive hearing loss from 250 Hz - 8000 Hz. Left ear-  Normal hearing from [phone] Hz, then mild to moderate presumably sensorineural hearing loss from 6000 Hz - 8000 Hz.  Speech Audiometry: Right ear- Speech Reception Threshold (SRT) was obtained at 30 dBHL, with contralateral masking. Left ear-Speech Reception Threshold (SRT) was obtained at 10 dBHL.   Word Recognition Score Tested using NU-6 (recorded) Right ear: 100% was obtained at a presentation level of 65 dBHL with contralateral masking which is deemed as  excellent. Left ear: 100% was obtained at a presentation level of 65 dBHL with contralateral masking which is deemed as  excellent.   The  hearing test results were completed under headphones and results are deemed to be of good reliability. Test technique:  conventional     Recommendations: Follow up with ENT as scheduled for today. Repeat audiogram after medical care.   Dakota Barker MARIE LEROUX-MARTINEZ, AUD

## 2023-12-20 NOTE — Progress Notes (Signed)
 ENT Progress Note:   Update 12/20/2023 He returns after hearing test today, which showed CHL with airb-ne gap and Type B tympanogram on the right. He has been using Xyzal  and Flonase  without relief of his sx. He is interested in ear tube placement as his ear issues have been recurrent and do not respond to medications.   Records Reviewed:  Initial Evaluation  Reason for Consult: right ear muffled hearing and pressure    HPI: Discussed the use of AI scribe software for clinical note transcription with the patient, who gave verbal consent to proceed.  History of Present Illness Dakota Barker is a 68 year old male who presents with right ear clogging and hearing issues. He was referred by Dr. Katrinka.  He experiences a sensation of a clogged right ear, recurring annually around October and November for several years, affecting his hearing on the right side with decreased auditory acuity. Occasionally, he can relieve the clogging with a Valsalva maneuver, but it tends to recur shortly after.  He has previously been prescribed steroids for this issue but cannot tolerate them due to side effects such as jitteriness and weight gain. Nasal sprays have also been used without relief. He has spring allergies but has recently stopped treating them.  No ear pain or tinnitus. He describes the sensation as 'clouded up' rather than painful. He has not had a recent hearing test.   Records Reviewed:  06/25/23 Dr Katrinka visit  # Covid 19 S:symptoms started on Friday include sinus congestion, sinus headache. No fever. No shortness of breath. Occasional slight sharp chest pain. Tylenol  for headache(s) but not in last 24 hours and has tried alk seltzer cold. Taking vitamin C  A/P: Patient with testing confirming covid 19 with first day of covid 19 symptoms fever 14th Vaccination status: Last COVID-vaccine in 2022     Past Medical History:  Diagnosis Date   ADHD (attention deficit hyperactivity  disorder)    no treatment   Allergy    seasonal   Hematuria    Hyperlipidemia    Skin cancer    nose- radiation only. Cared for regularly Dr. Rolan molt.   Tremor    benign, essential    Past Surgical History:  Procedure Laterality Date   ACDF for cervidal spondylitic myleopathy   10/27/2019   KNEE SURGERY  1979   right   left heel surgery     Dr. Jane.around 2000    Family History  Problem Relation Age of Onset   Diabetes Mother    Congestive Heart Failure Mother        passed at 38   Aortic aneurysm Mother    Hypertension Mother    Stroke Mother        71   Breast cancer Father 59   Diabetes Brother    Thyroid  cancer Brother    Other Brother        adopted   Colon cancer Cousin     Social History:  reports that he has been smoking cigars. He has never used smokeless tobacco. He reports current alcohol use of about 2.0 standard drinks of alcohol per week. He reports that he does not use drugs.  Allergies: No Known Allergies  Medications: I have reviewed the patient's current medications.  The PMH, PSH, Medications, Allergies, and SH were reviewed and updated.  ROS: Constitutional: Negative for fever, weight loss and weight gain. Cardiovascular: Negative for chest pain and dyspnea on exertion. Respiratory: Is not experiencing shortness  of breath at rest. Gastrointestinal: Negative for nausea and vomiting. Neurological: Negative for headaches. Psychiatric: The patient is not nervous/anxious  Blood pressure 131/81, pulse 61, SpO2 94%.  PHYSICAL EXAM:  Exam: General: Well-developed, well-nourished Respiratory Respiratory effort: Equal inspiration and expiration without stridor Cardiovascular Peripheral Vascular: Warm extremities with equal color/perfusion Eyes: No nystagmus with equal extraocular motion bilaterally Neuro/Psych/Balance: Patient oriented to person, place, and time; Appropriate mood and affect; Gait is intact with no imbalance; Cranial  nerves I-XII are intact Head and Face Inspection: Normocephalic and atraumatic without mass or lesion Palpation: Facial skeleton intact without bony stepoffs Salivary Glands: No mass or tenderness Facial Strength: Facial motility symmetric and full bilaterally ENT Pinna: External ear intact and fully developed External canal: Canal is patent with intact skin Tympanic Membrane: Clear and mobile External Nose: No scar or anatomic deformity Internal Nose: Septum intact and midline. No edema, polyp, or rhinorrhea Lips, Teeth, and gums: Mucosa and teeth intact and viable TMJ: No pain to palpation with full mobility Oral cavity/oropharynx: No erythema or exudate, no lesions present Neck Neck and Trachea: Midline trachea without mass or lesion Thyroid : No mass or nodularity Lymphatics: No lymphadenopathy  We attempted nasal endoscopy but he did not tolerate it and his nasal passages were narrow  Procedure Preoperative Diagnosis: Recurrent Otitis Media, Eustachian Tube Dysfunction  Postoperative Diagnosis: same  Procedure: Right sided Myringotomy and Tube Placement under local anesthesia  Surgeon: Lilliana Turner, MD   Medications: Ciprodex drops   Operative Procedure: The patient was  and placed in a supine position. The right external auditory canal was examined with a speculum and the cerumen was removed with a curette. The anterior inferior myringotomy was made and the middle ear suctioned. An Armstrong PE tube 1.14 was placed and Ciprodex was placed in the lumen of the tube.   Findings: serous fluid in right middle ear, suctioned  Studies reviewed Audio today Right ear with conductive hearing loss and Type B tymp  Left ear normal hearing and Type A tymp WDS 100% AU  Assessment/Plan: Encounter Diagnoses  Name Primary?   Decreased hearing, right Yes   Chronic mucoid otitis media of right ear    Dysfunction of right eustachian tube    Chronic nasal congestion     Environmental and seasonal allergies    Post-nasal drip      Assessment and Plan Assessment & Plan Decreased hearing ear pressure right side Eustachian tube dysfunction Hx of seasonal allergies   Suspect chronic right-sided eustachian tube dysfunction with ear clogging and decreased hearing. Symptoms relieved by Valsalva. Steroids not tolerated well. Nasal sprays and Zyrtec ineffective. Normal ear exam today. Did not tolerate nasal endoscopy Differential includes eustachian tube dysfunction vs hearing loss. - Order hearing test and tympanogram. - Prescribe Xyzal  for allergies. Stop Zyrtec - Prescribe Flonase  nasal spray. - RTC after hearing test  Update 12/20/23 Chronic right sided serous otitis medial, eustachian tube dysfunction and conductive hearing loss on the right and Type B tymp on the right based on hearing evaluation today. Risks and benefits of ear tube placement were discussed and patient elected to proceed. S/p R sided ear tube placement, serous fluid suctioned prior to placement.  - RTC 3 weeks for ear tube check  Chronic nasal congestion and Environmental allergies  - continue Xyzal  and Flonase .   Elena Larry, MD Otolaryngology [facility] Phone: [phone] Fax: [phone]    12/20/2023, 12:25 PM

## 2023-12-21 ENCOUNTER — Encounter: Payer: Self-pay | Admitting: Family Medicine

## 2023-12-21 ENCOUNTER — Ambulatory Visit: Payer: Self-pay | Admitting: Family Medicine

## 2023-12-21 ENCOUNTER — Ambulatory Visit: Admitting: Family Medicine

## 2023-12-21 VITALS — BP 108/68 | HR 68 | Temp 97.3°F | Ht 71.0 in | Wt 221.6 lb

## 2023-12-21 DIAGNOSIS — Z125 Encounter for screening for malignant neoplasm of prostate: Secondary | ICD-10-CM

## 2023-12-21 DIAGNOSIS — Z6830 Body mass index (BMI) 30.0-30.9, adult: Secondary | ICD-10-CM

## 2023-12-21 DIAGNOSIS — F411 Generalized anxiety disorder: Secondary | ICD-10-CM | POA: Diagnosis not present

## 2023-12-21 DIAGNOSIS — B9689 Other specified bacterial agents as the cause of diseases classified elsewhere: Secondary | ICD-10-CM

## 2023-12-21 DIAGNOSIS — R051 Acute cough: Secondary | ICD-10-CM | POA: Diagnosis not present

## 2023-12-21 DIAGNOSIS — J329 Chronic sinusitis, unspecified: Secondary | ICD-10-CM

## 2023-12-21 DIAGNOSIS — F17200 Nicotine dependence, unspecified, uncomplicated: Secondary | ICD-10-CM

## 2023-12-21 DIAGNOSIS — R7303 Prediabetes: Secondary | ICD-10-CM

## 2023-12-21 DIAGNOSIS — E669 Obesity, unspecified: Secondary | ICD-10-CM

## 2023-12-21 DIAGNOSIS — E785 Hyperlipidemia, unspecified: Secondary | ICD-10-CM

## 2023-12-21 DIAGNOSIS — Z131 Encounter for screening for diabetes mellitus: Secondary | ICD-10-CM

## 2023-12-21 LAB — POCT INFLUENZA A/B
Influenza A, POC: NEGATIVE
Influenza B, POC: NEGATIVE

## 2023-12-21 LAB — POC COVID19 BINAXNOW: SARS Coronavirus 2 Ag: NEGATIVE

## 2023-12-21 MED ORDER — DOXYCYCLINE HYCLATE 100 MG PO TABS: 100.0000 mg | ORAL_TABLET | Freq: Two times a day (BID) | ORAL | 0 refills | Status: AC

## 2023-12-21 MED ORDER — DIAZEPAM 5 MG PO TABS
ORAL_TABLET | ORAL | 0 refills | Status: AC
Start: 2023-12-21 — End: ?

## 2023-12-21 NOTE — Progress Notes (Signed)
 Phone [phone] In person visit   Subjective:   Dakota Barker is a 68 y.o. year old very pleasant male patient who presents for/with See problem oriented charting Chief Complaint  Patient presents with   Cough    X10 days     Past Medical History-  Patient Active Problem List   Diagnosis Date Noted   Incidental lung nodule, > 3mm and < 8mm 10/09/2019    Priority: High   Tobacco use disorder 02/23/2009    Priority: High   Aortic atherosclerosis (HCC) 10/18/2021    Priority: Medium    Benign paroxysmal positional vertigo 07/10/2019    Priority: Medium    Anxiety state 01/03/2016    Priority: Medium    Palpitations 06/05/2013    Priority: Medium    Hyperlipidemia, mild 12/10/2007    Priority: Medium    Benign essential tremor 12/10/2007    Priority: Medium    Reactive airway disease 08/03/2015    Priority: Low   Erectile dysfunction 03/05/2014    Priority: Low   Microscopic hematuria 12/10/2007    Priority: Low   Allergic rhinitis 01/04/2007    Priority: Low   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     doxycycline  (VIBRA -TABS) 100 MG tablet Take 1 tablet (100 mg total) by mouth 2 (two) times daily for 7 days. 14 tablet 0   fish oil-omega-3 fatty acids 1000 MG capsule Take 2 g by mouth daily.     fluticasone  (FLONASE ) 50 MCG/ACT nasal spray Place 2 sprays into both nostrils daily. 16 g 6   ibuprofen  (ADVIL ,MOTRIN ) 200 MG tablet Take 600-800 mg by mouth every 6 (six) hours as needed for moderate pain.     levocetirizine (XYZAL  ALLERGY 24HR) 5 MG tablet Take 1 tablet (5 mg total) by mouth every evening. 30 tablet 3   Multiple Vitamin (MULTIVITAMIN) tablet Take 1 tablet by mouth daily.     rosuvastatin  (CRESTOR ) 10 MG tablet TAKE ONE TABLET BY MOUTH DAILY 90 tablet 3   diazepam   (VALIUM ) 5 MG tablet TAKE 1 TABLET AS NEEDED ONCE DAILY FOR FLYING.do not drive for 1-87 hours after use. 30 tablet 0   No current facility-administered medications for this visit.     Objective:  BP 108/68 (BP Location: Left Arm, Patient Position: Sitting, Cuff Size: Normal)   Pulse 68   Temp (!) 97.3 F (36.3 C) (Temporal)   Ht 5' 11 (1.803 m)   Wt 221 lb 9.6 oz (100.5 kg)   SpO2 95%   BMI 30.91 kg/m  Gen: NAD, resting comfortably/well-appearing Maxillary sinus tenderness.  Nasal turbinates erythematous and edematous and more narrow on the left side.  Frontal sinuses nontender.  Tympanostomy tube in the right ear with some dried blood-reports recent surgery. CV: RRR no murmurs rubs or gallops Lungs: CTAB no crackles, wheeze, rhonchi Abdomen: soft/nontender/nondistended/normal bowel sounds. No rebound or guarding.  Ext: no edema Skin: warm, dry     Assessment and Plan    # Cough/nasal congestion S:deep cough for about 10 days. Coughs up clear phlegm.  Mildly winded with hills with golf for instance. No fever. Increasing sinus congestion in last few days. Got tube placed in right ear yesterday. No chest pain.  -has tried mucinex. Xyzal   in spring mainly- Flonase  mainly In spring.  -chest  CT in June reassuring with 5 mm nodule only A/P: Cough and nasal congestion over the last 10 days not improving despite time and conservative measures concerning for bacterial sinusitis.  Treat with 7 days of doxycycline -we discussed this offered better respiratory coverage for atypicals then Augmentin  though Augmentin  would be preferred for sinusitis.  He would prefer to have greater respiratory coverage after discussion though I think this is bacterial sinusitis -If fails improve could consider x-ray -Flu and COVID test negative today  # Current smoker-still smoking cigars-as always would encourage cessation  # Travel related anxiety-patient request refill for diazepam -has upcoming travel  planned and reports poor control without this.  Refilled medication.  #hyperlipidemia S: Medication: Rosuvastatin  10 mg daily Lab Results  Component Value Date   CHOL 175 07/28/2022   HDL 50.10 07/28/2022   LDLCALC 95 07/28/2022   LDLDIRECT 111.0 10/18/2021   TRIG 147.0 07/28/2022   CHOLHDL 3 07/28/2022   A/P: Patient has upcoming visit in October with labs scheduled a few days before-we will order lipid panel today, with CBC and CMP due to hyperlipidemia   # Hyperglycemia/insulin resistance/prediabetes S:  Medication: none Lab Results  Component Value Date   HGBA1C 5.7 07/28/2022   HGBA1C 5.6 10/18/2021   HGBA1C 5.4 07/10/2019    A/P: Prediabetes noted-needs labs for upcoming visit in October-ordered A1c today-when feeling better with focus on healthy eating/regular exercise/weight loss especially BMI over 30  Recommended follow up: Return for next already scheduled visit or sooner if needed. Future Appointments  Date Time Provider Department Center  02/07/2024  8:00 AM Okey Burns, MD [facility] None  02/11/2024  9:00 AM [facility]  02/14/2024  2:20 PM Katrinka Garnette KIDD, MD [facility]  10/29/2024 10:00 AM Bruning, Ashlyn, PA-C [facility] None    Lab/Order associations:   ICD-10-CM   1. Bacterial sinusitis  J32.9    B96.89     2. Anxiety state  F41.1 diazepam  (VALIUM ) 5 MG tablet    3. Acute cough  R05.1 POC COVID-19    POCT Influenza A/B    4. Prediabetes  R73.03     5. Hyperlipidemia, mild  E78.5 CBC with Differential/Platelet    Comprehensive metabolic panel with GFR    Lipid panel    6. Obesity (BMI 30-39.9)  E66.9 Hemoglobin A1c    7. Current smoker  F17.200 Urinalysis, Routine w reflex microscopic    8. Screening for diabetes mellitus  Z13.1 Hemoglobin A1c    9. Screening for prostate cancer  Z12.5 PSA, Medicare    10. BMI 30.0-30.9,adult  Z68.30       Meds ordered this encounter  Medications   doxycycline  (VIBRA -TABS) 100 MG  tablet    Sig: Take 1 tablet (100 mg total) by mouth 2 (two) times daily for 7 days.    Dispense:  14 tablet    Refill:  0   diazepam  (VALIUM ) 5 MG tablet    Sig: TAKE 1 TABLET AS NEEDED ONCE DAILY FOR FLYING.do not drive for 1-87 hours after use.    Dispense:  30 tablet    Refill:  0    Return precautions advised.  Garnette Katrinka, MD

## 2023-12-21 NOTE — Patient Instructions (Addendum)
 Test for COVID and flu- even if positive this could still be secondary bacterial sinusitis so covering with doxycyline. Avoid sun exposure due to photosensitive rash risk. 7 days of treatment. See us  back if fail to improve or symptoms worsen.    Recommended follow up: Return for next already scheduled visit or sooner if needed.

## 2023-12-31 ENCOUNTER — Telehealth: Payer: Self-pay | Admitting: Family Medicine

## 2023-12-31 NOTE — Telephone Encounter (Signed)
 A few options here: 1.  Try different antibiotic like Augmentin  which has better sinus coverage 2.  X-ray under cough.  Unfortunately it takes them about a week to get the x-ray back 3.  If he did not have substantial improvement while on doxycycline  possible this is a virus and we could give this another week to see how he does without further intervention or workup

## 2023-12-31 NOTE — Telephone Encounter (Signed)
 Please see patient message and advise.    Copied from CRM [id number]. Topic: Clinical - Medication Question >> Dec 28, 2023 11:33 AM Berneda FALCON wrote: Reason for CRM: Patient states he came in last week for an appt for a cough but the meds ran out and he still has the cough. Wants to know what he should do next if he needs more medication or what the next steps would be.  If so, please send to [facility] [place], [place] - [address] [place] [address] Phone: [phone] Fax: [phone] Hours: Not open 24 hours  Patient callback is [phone]

## 2024-01-01 ENCOUNTER — Ambulatory Visit

## 2024-01-01 ENCOUNTER — Other Ambulatory Visit: Payer: Self-pay | Admitting: Family Medicine

## 2024-01-01 DIAGNOSIS — R059 Cough, unspecified: Secondary | ICD-10-CM

## 2024-01-01 NOTE — Telephone Encounter (Signed)
 Spoke with patient. Coming 01/02/2024 at 0830 for 2 view chest xray. Will wait for second round of abx until we get results from xray

## 2024-01-02 ENCOUNTER — Ambulatory Visit (INDEPENDENT_AMBULATORY_CARE_PROVIDER_SITE_OTHER)

## 2024-01-02 DIAGNOSIS — R059 Cough, unspecified: Secondary | ICD-10-CM | POA: Diagnosis not present

## 2024-01-02 DIAGNOSIS — I700 Atherosclerosis of aorta: Secondary | ICD-10-CM | POA: Diagnosis not present

## 2024-01-10 ENCOUNTER — Ambulatory Visit: Payer: Self-pay | Admitting: Family Medicine

## 2024-01-18 ENCOUNTER — Encounter: Payer: BC Managed Care – PPO | Admitting: Family Medicine

## 2024-01-28 ENCOUNTER — Ambulatory Visit (INDEPENDENT_AMBULATORY_CARE_PROVIDER_SITE_OTHER): Admitting: Otolaryngology

## 2024-01-28 ENCOUNTER — Ambulatory Visit: Payer: Self-pay

## 2024-01-28 ENCOUNTER — Encounter (INDEPENDENT_AMBULATORY_CARE_PROVIDER_SITE_OTHER): Payer: Self-pay | Admitting: Otolaryngology

## 2024-01-28 ENCOUNTER — Telehealth (INDEPENDENT_AMBULATORY_CARE_PROVIDER_SITE_OTHER): Payer: Self-pay | Admitting: Otolaryngology

## 2024-01-28 VITALS — BP 123/69 | HR 65 | Temp 98.1°F

## 2024-01-28 DIAGNOSIS — H6993 Unspecified Eustachian tube disorder, bilateral: Secondary | ICD-10-CM

## 2024-01-28 DIAGNOSIS — H6990 Unspecified Eustachian tube disorder, unspecified ear: Secondary | ICD-10-CM

## 2024-01-28 DIAGNOSIS — R0982 Postnasal drip: Secondary | ICD-10-CM

## 2024-01-28 DIAGNOSIS — T161XXA Foreign body in right ear, initial encounter: Secondary | ICD-10-CM | POA: Diagnosis not present

## 2024-01-28 DIAGNOSIS — R0981 Nasal congestion: Secondary | ICD-10-CM

## 2024-01-28 DIAGNOSIS — H6523 Chronic serous otitis media, bilateral: Secondary | ICD-10-CM

## 2024-01-28 DIAGNOSIS — J3089 Other allergic rhinitis: Secondary | ICD-10-CM

## 2024-01-28 MED ORDER — OFLOXACIN 0.3 % OT SOLN: 5.0000 [drp] | Freq: Every day | OTIC | 0 refills | Status: AC

## 2024-01-28 MED ORDER — FLUTICASONE PROPIONATE 50 MCG/ACT NA SUSP: 2.0000 | Freq: Every day | NASAL | 6 refills | Status: AC

## 2024-01-28 MED ORDER — LEVOCETIRIZINE DIHYDROCHLORIDE 5 MG PO TABS: 5.0000 mg | ORAL_TABLET | Freq: Every evening | ORAL | 3 refills | Status: AC

## 2024-01-28 MED ORDER — METHYLPREDNISOLONE 4 MG PO TBPK: ORAL_TABLET | ORAL | 1 refills | Status: AC

## 2024-01-28 NOTE — Telephone Encounter (Signed)
 FYI Only or Action Required?: FYI only for provider.  Patient was last seen in primary care on 12/21/2023 by Katrinka Garnette KIDD, MD.  Called Nurse Triage reporting Ear Fullness.  Symptoms began about a month ago.  Interventions attempted: Rest, hydration, or home remedies.  Symptoms are: unchanged.  Triage Disposition: See PCP When Office is Open (Within 3 Days)  Patient/caregiver understands and will follow disposition?: Yes, will follow disposition  Copied from CRM #[id number]. Topic: Clinical - Red Word Triage >> Jan 28, 2024 10:37 AM Rosina BIRCH wrote: Red Word that prompted transfer to Nurse Triage: patient equilibrium is off due to his ears. Patient stated his ears are clogged up. Patient stated he saw a ear, nose and throat doctor a month ago and they put a tube in his ear and it was ok for a day Reason for Disposition  [1] Ear congestion lasts > 3 days AND [2] no improvement after using Care Advice  (Exception: Ear congestion is a chronic symptom.)  Answer Assessment - Initial Assessment Questions 1. LOCATION: Which ear is involved?       bilateral 2. SENSATION: Describe how the ear feels. (e.g., stuffy, full, plugged).      Full, plugged 3. ONSET:  When did the ear symptoms start?       Over a month ago, states that about a month ago ENT placed a tube 4. PAIN: Do you also have an earache? If Yes, ask: How bad is it? (Scale 0-10; none, mild, moderate or severe)     Intermittent, occasional pain in ear 5. CAUSE: What do you think is causing the ear congestion? (e.g., common cold, nasal allergies, recent flight, recent snorkeling)     Unsure, states he has been seen by numerous providers 6. OTHER SYMPTOMS: Do you have any other symptoms? (e.g., ear drainage, hay fever symptoms such as sneezing or a clear nasal discharge; cold symptoms such as a cough or runny nose)     Cough, stuffy nose, has had chest xr.  Protocols used: Ear - Congestion-A-AH

## 2024-01-28 NOTE — Progress Notes (Signed)
 ENT Progress Note:   Update 01/28/2024  Discussed the use of AI scribe software for clinical note transcription with the patient, who gave verbal consent to proceed.  History of Present Illness Dakota Barker is a 68 year old male with eustachian tube dysfunction, s/p R PET placement who presents with right ear pain.  He has been experiencing sharp, piercing pain in the ear since Friday. The pain is intermittent and sometimes accompanied by a 'ticking' sensation, which stops when he applies pressure to the area or changes his head position.  No drainage from the ear or sore throat. He has had phlegm since his last visit.  He is not currently on any blood thinners but is taking cholesterol medication. He previously used Xyz and Flonase  but is not currently using these medications.  Records Reviewed:    Update last OV He returns after hearing test today, which showed CHL with airb-ne gap and Type B tympanogram on the right. He has been using Xyzal  and Flonase  without relief of his sx. He is interested in ear tube placement as his ear issues have been recurrent and do not respond to medications.   Records Reviewed:  Initial Evaluation  Reason for Consult: right ear muffled hearing and pressure    HPI: Discussed the use of AI scribe software for clinical note transcription with the patient, who gave verbal consent to proceed.  History of Present Illness Dakota Barker is a 68 year old male who presents with right ear clogging and hearing issues. He was referred by Dr. Katrinka.  He experiences a sensation of a clogged right ear, recurring annually around October and November for several years, affecting his hearing on the right side with decreased auditory acuity. Occasionally, he can relieve the clogging with a Valsalva maneuver, but it tends to recur shortly after.  He has previously been prescribed steroids for this issue but cannot tolerate them due to side effects such as  jitteriness and weight gain. Nasal sprays have also been used without relief. He has spring allergies but has recently stopped treating them.  No ear pain or tinnitus. He describes the sensation as 'clouded up' rather than painful. He has not had a recent hearing test.   Records Reviewed:  06/25/23 Dr Katrinka visit  # Covid 19 S:symptoms started on Friday include sinus congestion, sinus headache. No fever. No shortness of breath. Occasional slight sharp chest pain. Tylenol  for headache(s) but not in last 24 hours and has tried alk seltzer cold. Taking vitamin C  A/P: Patient with testing confirming covid 19 with first day of covid 19 symptoms fever 14th Vaccination status: Last COVID-vaccine in 2022     Past Medical History:  Diagnosis Date   ADHD (attention deficit hyperactivity disorder)    no treatment   Allergy    seasonal   Hematuria    Hyperlipidemia    Skin cancer    nose- radiation only. Cared for regularly Dr. Rolan molt.   Tremor    benign, essential    Past Surgical History:  Procedure Laterality Date   ACDF for cervidal spondylitic myleopathy   10/27/2019   KNEE SURGERY  1979   right   left heel surgery     Dr. Jane.around 2000    Family History  Problem Relation Age of Onset   Diabetes Mother    Congestive Heart Failure Mother        passed at 64   Aortic aneurysm Mother    Hypertension Mother  Stroke Mother        50   Breast cancer Father 86   Diabetes Brother    Thyroid  cancer Brother    Other Brother        adopted   Colon cancer Cousin     Social History:  reports that he has been smoking cigars. He has never used smokeless tobacco. He reports current alcohol use of about 2.0 standard drinks of alcohol per week. He reports that he does not use drugs.  Allergies: No Known Allergies  Medications: I have reviewed the patient's current medications.  The PMH, PSH, Medications, Allergies, and SH were reviewed and  updated.  ROS: Constitutional: Negative for fever, weight loss and weight gain. Cardiovascular: Negative for chest pain and dyspnea on exertion. Respiratory: Is not experiencing shortness of breath at rest. Gastrointestinal: Negative for nausea and vomiting. Neurological: Negative for headaches. Psychiatric: The patient is not nervous/anxious  Blood pressure 123/69, pulse 65, temperature 98.1 F (36.7 C), SpO2 92%.  PHYSICAL EXAM:  Exam: General: Well-developed, well-nourished Respiratory Respiratory effort: Equal inspiration and expiration without stridor Cardiovascular Peripheral Vascular: Warm extremities with equal color/perfusion Eyes: No nystagmus with equal extraocular motion bilaterally Neuro/Psych/Balance: Patient oriented to person, place, and time; Appropriate mood and affect; Gait is intact with no imbalance; Cranial nerves I-XII are intact Head and Face Inspection: Normocephalic and atraumatic without mass or lesion Palpation: Facial skeleton intact without bony stepoffs Salivary Glands: No mass or tenderness Facial Strength: Facial motility symmetric and full bilaterally ENT Pinna: External ear intact and fully developed External canal: Canal is patent with intact skin Tympanic Membrane: R TM with PET in place, dry blood clot in canal partially blocking the PET L TM with air fluid level - clear fluid in middle ear External Nose: No scar or anatomic deformity Internal Nose: Septum intact and midline. No edema, polyp, or rhinorrhea Lips, Teeth, and gums: Mucosa and teeth intact and viable TMJ: No pain to palpation with full mobility Oral cavity/oropharynx: No erythema or exudate, no lesions present Neck Neck and Trachea: Midline trachea without mass or lesion Thyroid : No mass or nodularity Lymphatics: No lymphadenopathy  Procedure: Ear exam under microscope and removal of R sided ear canal blood clot/FB  Diagnosis: dry blood clot right ear canal   Informed  consent: Timeout performed and informed consent was obtained.  Procedure: Operating microscope was employed to evaluate the ear(s).  Alligator and right angle pickup was used to try and dislodge right ear canal clot, procedure not finished due to significant discomfort.   Findings: Normal appearing tympanic membranes and evidence of ear tube in good position, dry blood clot partially obstructing the entry into ear tube and positioned along the floor of the EAC, and external canals are normal otherwise no active bleeding.No middle ear fluid bilaterally.   Complications: None. Patient tolerated well.    Studies reviewed Audio today Right ear with conductive hearing loss and Type B tymp  Left ear normal hearing and Type A tymp WDS 100% AU  Assessment/Plan: Encounter Diagnoses  Name Primary?   Dysfunction of both eustachian tubes Yes   Chronic serous otitis media of both ears    Post-nasal drip    Environmental and seasonal allergies    Chronic nasal congestion       Assessment and Plan Assessment & Plan Decreased hearing ear pressure right side Eustachian tube dysfunction Hx of seasonal allergies   Suspect chronic right-sided eustachian tube dysfunction with ear clogging and decreased hearing. Symptoms relieved  by Valsalva. Steroids not tolerated well. Nasal sprays and Zyrtec ineffective. Normal ear exam today. Did not tolerate nasal endoscopy Differential includes eustachian tube dysfunction vs hearing loss. - Order hearing test and tympanogram. - Prescribe Xyzal  for allergies. Stop Zyrtec - Prescribe Flonase  nasal spray. - RTC after hearing test  Update 12/20/23 Chronic right sided serous otitis medial, eustachian tube dysfunction and conductive hearing loss on the right and Type B tymp on the right based on hearing evaluation today. Risks and benefits of ear tube placement were discussed and patient elected to proceed. S/p R sided ear tube placement, serous fluid suctioned  prior to placement.  - RTC 3 weeks for ear tube check  Chronic nasal congestion and Environmental allergies  - continue Xyzal  and Flonase .   Update 01/28/2024 Assessment and Plan Assessment & Plan A blood clot/FB in Right ear Rock-hard, dry blood clot in ear causing blockage of the previously placed ear tube. Unable to fully remove 2/2 discomfort during procedure  - Floxin  ear drops  R ear and return in as scheduled 02/07/24  Eustachian tube dysfunction with middle ear effusion Significant dysfunction with clear, non-infected fluid causing fullness and pressure sensation. - resume Xyzal  5 mg daily and Flonase  BID - Medrol  dose pack      Elena Larry, MD Otolaryngology [facility] Phone: [phone] Fax: [phone]    01/28/2024, 3:30 PM

## 2024-01-28 NOTE — Telephone Encounter (Signed)
 Lvm saying pain in ear wants to be seen sooner please advise

## 2024-01-29 ENCOUNTER — Ambulatory Visit: Admitting: Family Medicine

## 2024-01-29 NOTE — Telephone Encounter (Signed)
 Noted

## 2024-02-07 ENCOUNTER — Encounter (INDEPENDENT_AMBULATORY_CARE_PROVIDER_SITE_OTHER): Payer: Self-pay | Admitting: Otolaryngology

## 2024-02-07 ENCOUNTER — Ambulatory Visit (INDEPENDENT_AMBULATORY_CARE_PROVIDER_SITE_OTHER): Admitting: Otolaryngology

## 2024-02-07 VITALS — BP 136/83 | HR 66

## 2024-02-07 DIAGNOSIS — H6993 Unspecified Eustachian tube disorder, bilateral: Secondary | ICD-10-CM

## 2024-02-07 DIAGNOSIS — H6520 Chronic serous otitis media, unspecified ear: Secondary | ICD-10-CM | POA: Diagnosis not present

## 2024-02-07 DIAGNOSIS — H6523 Chronic serous otitis media, bilateral: Secondary | ICD-10-CM

## 2024-02-07 DIAGNOSIS — J3089 Other allergic rhinitis: Secondary | ICD-10-CM

## 2024-02-07 DIAGNOSIS — H9391 Unspecified disorder of right ear: Secondary | ICD-10-CM

## 2024-02-07 DIAGNOSIS — H90A11 Conductive hearing loss, unilateral, right ear with restricted hearing on the contralateral side: Secondary | ICD-10-CM

## 2024-02-07 DIAGNOSIS — H6990 Unspecified Eustachian tube disorder, unspecified ear: Secondary | ICD-10-CM | POA: Diagnosis not present

## 2024-02-07 DIAGNOSIS — R0981 Nasal congestion: Secondary | ICD-10-CM

## 2024-02-07 MED ORDER — METHYLPREDNISOLONE 4 MG PO TBPK
ORAL_TABLET | ORAL | 1 refills | Status: DC
Start: 2024-02-07 — End: 2024-02-14

## 2024-02-07 MED ORDER — FLUTICASONE PROPIONATE 50 MCG/ACT NA SUSP: 2.0000 | Freq: Two times a day (BID) | NASAL | 6 refills | Status: DC

## 2024-02-07 MED ORDER — AZITHROMYCIN 250 MG PO TABS: ORAL_TABLET | ORAL | 0 refills | Status: AC

## 2024-02-07 MED ORDER — LEVOCETIRIZINE DIHYDROCHLORIDE 5 MG PO TABS: 5.0000 mg | ORAL_TABLET | Freq: Every evening | ORAL | 3 refills | Status: DC

## 2024-02-07 MED ORDER — CIPROFLOXACIN-DEXAMETHASONE 0.3-0.1 % OT SUSP: 4.0000 [drp] | Freq: Two times a day (BID) | OTIC | 0 refills | Status: AC

## 2024-02-07 NOTE — Progress Notes (Signed)
 ENT Progress Note:   Update 02/07/2024  Discussed the use of AI scribe software for clinical note transcription with the patient, who gave verbal consent to proceed.  History of Present Illness Dakota Barker is a 68 year old male who presents with ear drainage and hearing difficulties.  He has been experiencing ear drainage for the past couple of days, describing it as a sensation of being 'in a tunnel' without any associated pain. The drainage is significant enough that he can hear it when he moves his head.  He has difficulty hearing from both ears, with the right ear currently draining. Despite knowing it is not recommended, he has been using a Q-tip to manage the fluid, which sometimes forms a 'puddle' in his ear.  He has a history of cold symptoms, including a cough and phlegm, lasting about four weeks prior to the onset of his ear symptoms. He believes these symptoms may have contributed to his current ear issues.   He is currently taking Xyzal , but has run out of this medication. He also uses Flonase . He is concerned about the fluid in his ear and inquires about the possibility of taking a Z-Pak or other medications to alleviate the fluid buildup. He took Medrol  pack and was doing ear drops we prescribed. His ear pain resolved.   Records Reviewed:  Update last OV He returns after hearing test today, which showed CHL with airb-ne gap and Type B tympanogram on the right. He has been using Xyzal  and Flonase  without relief of his sx. He is interested in ear tube placement as his ear issues have been recurrent and do not respond to medications.   Initial Evaluation  Reason for Consult: right ear muffled hearing and pressure    HPI: Discussed the use of AI scribe software for clinical note transcription with the patient, who gave verbal consent to proceed.  History of Present Illness Dakota Barker is a 68 year old male who presents with right ear clogging and hearing issues.  He was referred by Dr. Katrinka.  He experiences a sensation of a clogged right ear, recurring annually around October and November for several years, affecting his hearing on the right side with decreased auditory acuity. Occasionally, he can relieve the clogging with a Valsalva maneuver, but it tends to recur shortly after.  He has previously been prescribed steroids for this issue but cannot tolerate them due to side effects such as jitteriness and weight gain. Nasal sprays have also been used without relief. He has spring allergies but has recently stopped treating them.  No ear pain or tinnitus. He describes the sensation as 'clouded up' rather than painful. He has not had a recent hearing test.    Dakota Barker is a 68 year old male who presents with ear drainage and hearing difficulties.  He has been experiencing ear drainage for the past couple of days, describing it as a sensation of being 'in a tunnel' without any associated pain. The drainage is significant enough that he can hear it when he moves his head.  He has difficulty hearing from both ears, with the right ear currently draining. Despite knowing it is not recommended, he has been using a Q-tip to manage the fluid, which sometimes forms a 'puddle' in his ear.  He has a history of cold symptoms, including a cough and phlegm, lasting about four weeks prior to the onset of his ear symptoms. He believes these symptoms may have contributed to his  current ear issues. He reports that a recent hearing test showed certain sounds he could not hear, and he recalls being told there was fluid in the right ear.  He is currently taking Xyzal , which he describes as similar to Zyrtec, but has run out of this medication. He also uses Flonase . He is concerned about the fluid in his ear and inquires about the possibility of taking a Z-Pak or other medications to alleviate the fluid buildup.  No ear pain. History of cough and phlegm lasting four  weeks prior to the ear drainage. Difficulty hearing from both ears, with the right ear currently draining.  Records Reviewed:  06/25/23 Dr Katrinka visit  # Covid 19 S:symptoms started on Friday include sinus congestion, sinus headache. No fever. No shortness of breath. Occasional slight sharp chest pain. Tylenol  for headache(s) but not in last 24 hours and has tried alk seltzer cold. Taking vitamin C  A/P: Patient with testing confirming covid 19 with first day of covid 19 symptoms fever 14th Vaccination status: Last COVID-vaccine in 2022     Past Medical History:  Diagnosis Date   ADHD (attention deficit hyperactivity disorder)    no treatment   Allergy    seasonal   Hematuria    Hyperlipidemia    Skin cancer    nose- radiation only. Cared for regularly Dr. Rolan molt.   Tremor    benign, essential    Past Surgical History:  Procedure Laterality Date   ACDF for cervidal spondylitic myleopathy   10/27/2019   KNEE SURGERY  1979   right   left heel surgery     Dr. Jane.around 2000    Family History  Problem Relation Age of Onset   Diabetes Mother    Congestive Heart Failure Mother        passed at 14   Aortic aneurysm Mother    Hypertension Mother    Stroke Mother        71   Breast cancer Father 27   Diabetes Brother    Thyroid  cancer Brother    Other Brother        adopted   Colon cancer Cousin     Social History:  reports that he has been smoking cigars. He has never used smokeless tobacco. He reports current alcohol use of about 2.0 standard drinks of alcohol per week. He reports that he does not use drugs.  Allergies: No Known Allergies  Medications: I have reviewed the patient's current medications.  The PMH, PSH, Medications, Allergies, and SH were reviewed and updated.  ROS: Constitutional: Negative for fever, weight loss and weight gain. Cardiovascular: Negative for chest pain and dyspnea on exertion. Respiratory: Is not experiencing shortness of  breath at rest. Gastrointestinal: Negative for nausea and vomiting. Neurological: Negative for headaches. Psychiatric: The patient is not nervous/anxious  Blood pressure 136/83, pulse 66, SpO2 93%.  PHYSICAL EXAM:  Exam: General: Well-developed, well-nourished Respiratory Respiratory effort: Equal inspiration and expiration without stridor Cardiovascular Peripheral Vascular: Warm extremities with equal color/perfusion Eyes: No nystagmus with equal extraocular motion bilaterally Neuro/Psych/Balance: Patient oriented to person, place, and time; Appropriate mood and affect; Gait is intact with no imbalance; Cranial nerves I-XII are intact Head and Face Inspection: Normocephalic and atraumatic without mass or lesion Palpation: Facial skeleton intact without bony stepoffs Salivary Glands: No mass or tenderness Facial Strength: Facial motility symmetric and full bilaterally ENT Pinna: External ear intact and fully developed External canal: Canal is patent with intact skin Tympanic Membrane:  R TM with PET in place, after fluid and debris has been suctioned out. Mild erythema of the R TM L TM clear but cannot rule out serous effusion External Nose: No scar or anatomic deformity Internal Nose: Septum intact and midline. No edema, polyp, or rhinorrhea Lips, Teeth, and gums: Mucosa and teeth intact and viable TMJ: No pain to palpation with full mobility Oral cavity/oropharynx: No erythema or exudate, no lesions present Neck Neck and Trachea: Midline trachea without mass or lesion Thyroid : No mass or nodularity Lymphatics: No lymphadenopathy  Procedure: Ear exam under microscope and removal of R sided ear canal canal drainage and debris   Diagnosis: debris and secretions in R EAC  Informed consent: Timeout performed and informed consent was obtained.  Procedure: Operating microscope was employed to evaluate the ear(s).  Alligator and right angle pickup was used to try and dislodge  right ear canal clot, procedure not finished due to significant discomfort.   Findings: Mild TM erythema on the right with ear tube in good position, debris and clear drainage in EAC and middle ear on the right, left TM dull cannot rule out serous effusion  Complications: None. Patient tolerated well.    Studies reviewed Audio 12/20/23 Right ear with conductive hearing loss and Type B tymp  Left ear normal hearing and Type A tymp WDS 100% AU  Assessment/Plan: Encounter Diagnoses  Name Primary?   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 Symptoms relieved by Valsalva. Steroids not tolerated well. Nasal sprays and Zyrtec ineffective. Normal ear exam today. Did not tolerate nasal endoscopy Differential includes eustachian tube dysfunction vs hearing loss. - Order hearing test and tympanogram. - Prescribe Xyzal  for allergies. Stop Zyrtec - Prescribe Flonase  nasal spray. - RTC after hearing test   Bilateral middle ear effusion with Eustachian tube dysfunction Fluid accumulation in ears with right ear drainage, left ear clogged. Likely exacerbated by recent upper respiratory infection. Eustachian tube dysfunction contributes to fluid retention. - Prescribe Z-Pak (azithromycin). - Prescribe Medrol  pack (steroid taper). - Prescribe Flonase . - Prescribe Xyzal . - Consider Eustachian tube dilation if symptoms persist. - Schedule follow-up in 3 weeks.  Acute upper respiratory infection with cough and congestion Recent infection with cough and phlegm, contributing to nasal congestion and  Eustachian tube dysfunction. - Continue current medications to manage symptoms and reduce congestion.  Left sensorineural hearing loss Mild sensorineural hearing loss in left ear. Current fluid may affect hearing perception. Further assessment if symptoms persist post-effusion treatment.  Update 12/20/23 Chronic right sided serous otitis medial, eustachian tube dysfunction and conductive hearing loss on the right and Type B tymp on the right based on hearing evaluation today. Risks and benefits of ear tube placement were discussed and patient elected to proceed. S/p R sided ear tube placement, serous fluid suctioned prior to placement.  - RTC 3 weeks for ear tube check  Chronic nasal congestion and Environmental allergies  - continue Xyzal  and Flonase .   Update 02/07/2024 Assessment and Plan Assessment & Plan Chronic otitis media with effusion  Ear tube is not patent after removal of debris and suctioning  of the fluid/drainage Mild R TM erythema  - Ciprodex ggt to right ear  Eustachian tube dysfunction with middle ear effusion Significant dysfunction with clear, non-infected fluid causing fullness and pressure sensation. - continue Xyzal  5 mg daily and Flonase  BID. Refills sent  - will repeat Medrol  dose pack  - will consider eustachian tube dilation and left ear tube placement if sx persist   Elena Larry, MD [facility] Phone: [phone] Fax: [phone]    02/07/2024, 9:06 AM

## 2024-02-11 ENCOUNTER — Other Ambulatory Visit

## 2024-02-14 ENCOUNTER — Encounter: Payer: Self-pay | Admitting: Family Medicine

## 2024-02-14 ENCOUNTER — Ambulatory Visit (INDEPENDENT_AMBULATORY_CARE_PROVIDER_SITE_OTHER): Admitting: Family Medicine

## 2024-02-14 VITALS — BP 118/60 | HR 66 | Temp 98.4°F | Ht 71.0 in | Wt 220.6 lb

## 2024-02-14 DIAGNOSIS — Z131 Encounter for screening for diabetes mellitus: Secondary | ICD-10-CM

## 2024-02-14 DIAGNOSIS — F17200 Nicotine dependence, unspecified, uncomplicated: Secondary | ICD-10-CM

## 2024-02-14 DIAGNOSIS — Z23 Encounter for immunization: Secondary | ICD-10-CM

## 2024-02-14 DIAGNOSIS — E785 Hyperlipidemia, unspecified: Secondary | ICD-10-CM | POA: Diagnosis not present

## 2024-02-14 DIAGNOSIS — Z125 Encounter for screening for malignant neoplasm of prostate: Secondary | ICD-10-CM

## 2024-02-14 DIAGNOSIS — Z0000 Encounter for general adult medical examination without abnormal findings: Secondary | ICD-10-CM

## 2024-02-14 DIAGNOSIS — E669 Obesity, unspecified: Secondary | ICD-10-CM | POA: Diagnosis not present

## 2024-02-14 NOTE — Patient Instructions (Addendum)
 Please stop by lab before you go If you have mychart- we will send your results within 3 business days of us  receiving them.  If you do not have mychart- we will call you about results within 5 business days of us  receiving them.  *please also note that you will see labs on mychart as soon as they post. I will later go in and write notes on them- will say notes from Dr. Katrinka   No changes today unless labs lead us  to make changes  Other than as always encourage quitting smoking cigars  Recommended follow up: Return in about 1 year (around 02/13/2025) for physical or sooner if needed.Schedule b4 you leave.

## 2024-02-14 NOTE — Progress Notes (Signed)
 Phone: [phone]   Subjective:  Patient presents today for their annual physical. Chief complaint-noted.   See problem oriented charting- ROS- full  review of systems was completed and negative  except for topics noted under acute/chronic concerns  The following were reviewed and entered/updated in epic: Past Medical History:  Diagnosis Date   ADHD (attention deficit hyperactivity disorder)    no treatment   Allergy    seasonal   Hematuria    Hyperlipidemia    Skin cancer    nose- radiation only. Cared for regularly Dr. Rolan molt.   Tremor    benign, essential   Patient Active Problem List   Diagnosis Date Noted   Incidental lung nodule, > 3mm and < 8mm 10/09/2019    Priority: High   Tobacco use disorder 02/23/2009    Priority: High   Aortic atherosclerosis 10/18/2021    Priority: Medium    Benign paroxysmal positional vertigo 07/10/2019    Priority: Medium    Anxiety state 01/03/2016    Priority: Medium    Palpitations 06/05/2013    Priority: Medium    Hyperlipidemia, mild 12/10/2007    Priority: Medium    Benign essential tremor 12/10/2007    Priority: Medium    Reactive airway disease 08/03/2015    Priority: Low   Erectile dysfunction 03/05/2014    Priority: Low   Microscopic hematuria 12/10/2007    Priority: Low   Allergic rhinitis 01/04/2007    Priority: Low   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 Jane.around 2000    Family History  Problem Relation Age of Onset   Diabetes Mother    Congestive Heart Failure Mother        passed at 81   Aortic aneurysm Mother    Hypertension Mother    Stroke Mother        46   Breast cancer Father 47   Diabetes Brother    Thyroid  cancer Brother    Other Brother        adopted   Colon cancer Cousin     Medications-  reviewed and updated Current Outpatient Medications  Medication Sig Dispense Refill   aspirin EC 81 MG tablet Take 1 tablet (81 mg total) by mouth daily.     ciprofloxacin-dexamethasone (CIPRODEX) OTIC suspension Place 4 drops into the right ear 2 (two) times daily. 7.5 mL 0   diazepam  (VALIUM ) 5 MG tablet TAKE 1 TABLET AS NEEDED ONCE DAILY FOR FLYING.do not drive for 1-87 hours after use. 30 tablet 0   fish oil-omega-3 fatty acids 1000 MG capsule Take 2 g by mouth daily.     fluticasone  (FLONASE ) 50 MCG/ACT nasal spray Place 2 sprays into both nostrils daily. 16 g 6   ibuprofen  (ADVIL ,MOTRIN ) 200 MG tablet Take 600-800 mg by mouth every 6 (six) hours as needed for moderate pain.     levocetirizine (XYZAL  ALLERGY 24HR) 5 MG tablet Take 1 tablet (5 mg total) by mouth every evening. 30 tablet 3   Multiple Vitamin (MULTIVITAMIN) tablet Take 1 tablet by mouth daily.     rosuvastatin  (CRESTOR ) 10 MG tablet TAKE ONE TABLET BY MOUTH DAILY 90 tablet 3   methylPREDNISolone  (MEDROL  DOSEPAK) 4 MG TBPK tablet Take with signs of chronic sinusitis and take as  directed (Patient not taking: Reported on 02/14/2024) 1 each 1   No current facility-administered medications for this visit.    Allergies-reviewed and updated No Known Allergies  Social History   Social History Narrative   Married 1984. 3 children- 29/28/27- in 2017. 1 grandchild on way 2017 (lives in [place] for 2 years)      Works in [employer]- [employer]. Enjoys his work- 36 years in 2017.       Hobbies: golf, enjoys cigars      Objective  Objective:  BP 118/60 (BP Location: Left Arm, Patient Position: Sitting, Cuff Size: Normal)   Pulse 66   Temp 98.4 F (36.9 C) (Temporal)   Ht 5' 11 (1.803 m)   Wt 220 lb 9.6 oz (100.1 kg)   SpO2 94%   BMI 30.77 kg/m  Gen: NAD, resting comfortably HEENT: Mucous membranes are moist. Oropharynx normal Neck: no thyromegaly CV: RRR no murmurs rubs or gallops Lungs: CTAB no crackles,  wheeze, rhonchi Abdomen: soft/nontender/nondistended/normal bowel sounds. No rebound or guarding.  Ext: no edema Skin: warm, dry Neuro: grossly normal, moves all extremities, PERRLA   Assessment and Plan  68 y.o. male presenting for annual physical.  Health Maintenance counseling: 1. Anticipatory guidance: Patient counseled regarding regular dental exams -q6 months, eye exams - about 3 years ago- may consider around 71,  avoiding smoking and second hand smoke- see below , limiting alcohol to 2 beverages per day - 3-4 scotch a week, no illicit drugs.   2. Risk factor reduction:  Advised patient of need for regular exercise and diet rich and fruits and vegetables to reduce risk of heart attack and stroke.  Exercise- walked dog for first 3-4 months and now just taking him out back- encouraged to restart walking.  Diet/weight management-down 3 lbs from last physical- plus just had lunch- discussed healthy eating and regular exercise and working on continued mild weight loss. Atkins worked for him in the past  Wt Readings from Last 3 Encounters:  02/14/24 220 lb 9.6 oz (100.1 kg)  12/21/23 221 lb 9.6 oz (100.5 kg)  10/23/23 213 lb (96.6 kg)  3. Immunizations/screenings/ancillary studies- flu shot today, has opted out of COVID at this point Immunization History  Administered Date(s) Administered   INFLUENZA, HIGH DOSE SEASONAL PF 02/19/2022, 02/14/2024   Influenza Split 01/24/2011   Influenza Whole 03/18/2009, 03/24/2010   Influenza, Seasonal, Injecte, Preservative Fre 04/22/2012   Influenza,inj,Quad PF,6+ Mos 03/04/2013, 03/23/2015, 04/20/2016, 04/12/2017, 03/08/2018   Influenza-Unspecified 02/26/2014   PFIZER(Purple Top)SARS-COV-2 Vaccination 05/23/2019, 06/13/2019, 03/03/2020, 09/30/2020   PNEUMOCOCCAL CONJUGATE-20 07/28/2022   Td 05/08/2002   Tdap 03/04/2013   Zoster Recombinant(Shingrix ) 03/08/2018, 06/21/2018  4. Prostate cancer screening- low risk prior trend- update psa today   Lab  Results  Component Value Date   PSA 1.45 07/28/2022   PSA 1.28 10/18/2021   PSA 1.33 07/10/2019   5. Colon cancer screening - 03/25/2016 with 10 year repeat- he's considering reachout out sooner 6. Skin cancer screening- sees Dr. Joshua regularly. advised regular sunscreen use. Denies worrisome, changing, or new skin lesions.  7. Smoking associated screening (lung cancer screening, AAA screen 65-75, UA)-current smoker-still smoking cigars-encouraged full cessation. Get UA at least. 09/12/22 was reassuring and low risk AAA scan 8. STD screening - only active with wife  Status of chronic or acute concerns   # Hearing loss-working with ENT with upcoming visit in 2 weeks- unfortunately has not gotten better and has worsened- upcoming visit to reevaluate . Has been  on steroids and ear drops  # travel  related anxiety-uses diazepam  as needed for travel- good for now    # Hyperlipidemia-on rosuvastatin  10 mg daily-she is due for repeat panel-has been slightly above ideal goal. Cramps on statin- picklejuice helps Lab Results  Component Value Date   CHOL 175 07/28/2022   HDL 50.10 07/28/2022   LDLCALC 95 07/28/2022   LDLDIRECT 111.0 10/18/2021   TRIG 147.0 07/28/2022   CHOLHDL 3 07/28/2022    # Prediabetes-he will have A1c with upcoming labs Lab Results  Component Value Date   HGBA1C 5.7 07/28/2022   HGBA1C 5.6 10/18/2021   HGBA1C 5.4 07/10/2019   Recommended follow up: Return in about 1 year (around 02/13/2025) for physical or sooner if needed.Schedule b4 you leave. Future Appointments  Date Time Provider Department Center  02/28/2024  8:00 AM Okey Burns, MD [facility] None  10/29/2024 10:00 AM Bruning, Ashlyn, PA-C [facility] None   Lab/Order associations:NOT fasting- 8 inch chicken philly   ICD-10-CM   1. Preventative health care  Z00.00     2. Immunization due  Z23 Flu vaccine HIGH DOSE PF(Fluzone Trivalent)    3. Tobacco use disorder  F17.200       No orders of the  defined types were placed in this encounter.   Return precautions advised.  Garnette Lukes, MD

## 2024-02-15 ENCOUNTER — Ambulatory Visit: Payer: Self-pay | Admitting: Family Medicine

## 2024-02-15 LAB — URINALYSIS, ROUTINE W REFLEX MICROSCOPIC
Bilirubin Urine: NEGATIVE
Ketones, ur: NEGATIVE
Leukocytes,Ua: NEGATIVE
Nitrite: NEGATIVE
Specific Gravity, Urine: >=1.03 — AB (ref 1.000–1.030)
Total Protein, Urine: NEGATIVE
Urine Glucose: NEGATIVE
Urobilinogen, UA: 0.2 (ref 0.0–1.0)
pH: 6 (ref 5.0–8.0)

## 2024-02-15 LAB — CBC WITH DIFFERENTIAL/PLATELET
Basophils Absolute: 0 K/uL (ref 0.0–0.1)
Basophils Relative: 0.6 % (ref 0.0–3.0)
Eosinophils Absolute: 0.3 K/uL (ref 0.0–0.7)
Eosinophils Relative: 4.1 % (ref 0.0–5.0)
HCT: 46.3 % (ref 39.0–52.0)
Hemoglobin: 15.3 g/dL (ref 13.0–17.0)
Lymphocytes Relative: 39.4 % (ref 12.0–46.0)
Lymphs Abs: 2.6 K/uL (ref 0.7–4.0)
MCHC: 33 g/dL (ref 30.0–36.0)
MCV: 99.4 fl (ref 78.0–100.0)
Monocytes Absolute: 0.5 K/uL (ref 0.1–1.0)
Monocytes Relative: 7.6 % (ref 3.0–12.0)
Neutro Abs: 3.2 K/uL (ref 1.4–7.7)
Neutrophils Relative %: 48.3 % (ref 43.0–77.0)
Platelets: 280 K/uL (ref 150.0–400.0)
RBC: 4.65 Mil/uL (ref 4.22–5.81)
RDW: 13.4 % (ref 11.5–15.5)
WBC: 6.6 K/uL (ref 4.0–10.5)

## 2024-02-15 LAB — COMPREHENSIVE METABOLIC PANEL WITH GFR
ALT: 14 U/L (ref 0–53)
AST: 18 U/L (ref 0–37)
Albumin: 4.3 g/dL (ref 3.5–5.2)
Alkaline Phosphatase: 63 U/L (ref 39–117)
BUN: 23 mg/dL (ref 6–23)
CO2: 27 meq/L (ref 19–32)
Calcium: 9.1 mg/dL (ref 8.4–10.5)
Chloride: 106 meq/L (ref 96–112)
Creatinine, Ser: 1.02 mg/dL (ref 0.40–1.50)
GFR: 76.11 mL/min (ref >60.00)
Glucose, Bld: 105 mg/dL — ABNORMAL HIGH (ref 70–99)
Potassium: 4.7 meq/L (ref 3.5–5.1)
Sodium: 142 meq/L (ref 135–145)
Total Bilirubin: 0.3 mg/dL (ref 0.2–1.2)
Total Protein: 6.5 g/dL (ref 6.0–8.3)

## 2024-02-15 LAB — LIPID PANEL
Cholesterol: 144 mg/dL (ref 0–200)
HDL: 41.1 mg/dL (ref >39.00)
LDL Cholesterol: 38 mg/dL (ref 0–99)
NonHDL: 102.99
Total CHOL/HDL Ratio: 4
Triglycerides: 324 mg/dL — ABNORMAL HIGH (ref 0.0–149.0)
VLDL: 64.8 mg/dL — ABNORMAL HIGH (ref 0.0–40.0)

## 2024-02-15 LAB — HEMOGLOBIN A1C: Hgb A1c MFr Bld: 5.8 % (ref 4.6–6.5)

## 2024-02-15 LAB — PSA: PSA: 1.86 ng/mL (ref 0.10–4.00)

## 2024-02-28 ENCOUNTER — Ambulatory Visit (INDEPENDENT_AMBULATORY_CARE_PROVIDER_SITE_OTHER): Admitting: Otolaryngology

## 2024-03-04 ENCOUNTER — Ambulatory Visit (INDEPENDENT_AMBULATORY_CARE_PROVIDER_SITE_OTHER): Admitting: Otolaryngology

## 2024-03-04 ENCOUNTER — Encounter (INDEPENDENT_AMBULATORY_CARE_PROVIDER_SITE_OTHER): Payer: Self-pay | Admitting: Otolaryngology

## 2024-03-04 VITALS — BP 127/77 | HR 60

## 2024-03-04 DIAGNOSIS — H6993 Unspecified Eustachian tube disorder, bilateral: Secondary | ICD-10-CM | POA: Diagnosis not present

## 2024-03-04 DIAGNOSIS — H6991 Unspecified Eustachian tube disorder, right ear: Secondary | ICD-10-CM

## 2024-03-04 DIAGNOSIS — H6691 Otitis media, unspecified, right ear: Secondary | ICD-10-CM

## 2024-03-04 DIAGNOSIS — H66001 Acute suppurative otitis media without spontaneous rupture of ear drum, right ear: Secondary | ICD-10-CM

## 2024-03-04 NOTE — Progress Notes (Signed)
 Reason for follow up:ETD Referring Physician: Dr Katrinka Cordella FORBES Dakota Barker is an 68 y.o. male.  HPI: He is here for follow-up for a history of eustachian tube dysfunction.  He started out 6 weeks ago with a fullness and feeling of decreased hearing in the right ear.  He was treated with eustachian tube dysfunction treatments of decongestions, nasal steroid sprays, and a steroid pack.  He continued to have the problem.  He has this issue about once a year but this year is particularly worse.  He was given a tympanostomy tube in the right ear.  It bled afterwards and apparently clogged up.  He has been treated with drops.  He now continues to have a drainage from the right ear.  Still has a fullness.  Left ear now is giving him the same problems.  No significant nasal obstruction or epistaxis.  Past Medical History:  Diagnosis Date   ADHD (attention deficit hyperactivity disorder)    no treatment   Allergy    seasonal   Hematuria    Hyperlipidemia    Skin cancer    nose- radiation only. Cared for regularly Dr. Rolan molt.   Tremor    benign, essential    Past Surgical History:  Procedure Laterality Date   ACDF for cervidal spondylitic myleopathy   10/27/2019   KNEE SURGERY  1979   right   left heel surgery     Dr. Jane.around 2000    Family History  Problem Relation Age of Onset   Diabetes Mother    Congestive Heart Failure Mother        passed at 26   Aortic aneurysm Mother    Hypertension Mother    Stroke Mother        68   Breast cancer Father 33   Diabetes Brother    Thyroid  cancer Brother    Other Brother        adopted   Colon cancer Cousin     Social History:  reports that he has been smoking cigars. He has never used smokeless tobacco. He reports current alcohol use of about 2.0 standard drinks of alcohol per week. He reports that he does not use drugs.  Allergies: No Known Allergies  Medications: I have reviewed the patient's current medications.  No  results found for this or any previous visit (from the past 48 hours).  No results found.  ROS Blood pressure 127/77, pulse 60, SpO2 94%. Physical Exam Constitutional:      Appearance: Normal appearance.  HENT:     Head: Normocephalic and atraumatic.     Right Ear: Tympanic membrane has a tympanostomy tube in place and there is purulent material draining from it and a yellow coloration tympanic membrane is thickened, ear canal and external ear normal.     Left Ear: Tympanic membrane is with a obvious serous middle ear effusion, ear canal and external ear normal.  The 512 and 256 tuning fork to both are midline and bone-conduction is greater than air conduction bilaterally    Nose: Nose normal. No significant swelling or masses.     Oral cavity/oropharynx: Mucous membranes are moist. No lesions or masses    Larynx: normal voice. Mirror attempted without success    Eyes:     Extraocular Movements: Extraocular movements intact.     Conjunctiva/sclera: Conjunctivae normal.     Pupils: Pupils are equal, round, and reactive to light.  Cardiovascular:     Rate and Rhythm: Normal rate.  Pulmonary:     Effort: Pulmonary effort is normal.  Musculoskeletal:     Cervical back: Normal range of motion and neck supple. No rigidity.  Lymphadenopathy:     Cervical: No cervical adenopathy or masses.salivary glands without lesions. .  Neurological:     Mental Status: He is alert. CN 2-12 intact. No nystagmus      Assessment/Plan: Eustachian tube dysfunction/right acute otitis media-he definitely has eustachian tube dysfunction with both sides now involved.  He has fluid in the left ear and a tube in the right ear that is draining a purulent material.  He will be placed on Ciprodex which he already has 4 drops twice a day.  He will follow-up in 1 week.  Practice strict water precautions.  When he returns we will contemplate changing the right tube to a fresh tube and placing a left tympanostomy  tube as well as performing a nasal endoscopy of his nasopharynx.  Dakota Barker Notice 03/04/2024, 11:21 AM

## 2024-03-11 ENCOUNTER — Ambulatory Visit (INDEPENDENT_AMBULATORY_CARE_PROVIDER_SITE_OTHER): Admitting: Otolaryngology

## 2024-03-11 ENCOUNTER — Encounter (INDEPENDENT_AMBULATORY_CARE_PROVIDER_SITE_OTHER): Payer: Self-pay | Admitting: Otolaryngology

## 2024-03-11 ENCOUNTER — Other Ambulatory Visit (HOSPITAL_COMMUNITY)
Admission: RE | Admit: 2024-03-11 | Discharge: 2024-03-11 | Disposition: A | Discharge status: Home / Self Care | Source: Other Acute Inpatient Hospital | Attending: Otolaryngology | Admitting: Otolaryngology

## 2024-03-11 VITALS — BP 112/76 | HR 67

## 2024-03-11 DIAGNOSIS — H9392 Unspecified disorder of left ear: Secondary | ICD-10-CM | POA: Diagnosis not present

## 2024-03-11 DIAGNOSIS — H6691 Otitis media, unspecified, right ear: Secondary | ICD-10-CM

## 2024-03-11 DIAGNOSIS — H6523 Chronic serous otitis media, bilateral: Secondary | ICD-10-CM | POA: Insufficient documentation

## 2024-03-11 DIAGNOSIS — H66001 Acute suppurative otitis media without spontaneous rupture of ear drum, right ear: Secondary | ICD-10-CM

## 2024-03-11 NOTE — Progress Notes (Signed)
 Dakota Barker is an 68 y.o. male.   Chief Complaint: ear pressure HPI: History of previous ear issues and fluid in the ear.  He had an infection in the right ear with a tube.  He had what appeared to be fluid in the left ear.  Eustachian tube dysfunction as well as otitis media on the right  Past Medical History:  Diagnosis Date   ADHD (attention deficit hyperactivity disorder)    no treatment   Allergy    seasonal   Hematuria    Hyperlipidemia    Skin cancer    nose- radiation only. Cared for regularly Dr. Rolan molt.   Tremor    benign, essential    Past Surgical History:  Procedure Laterality Date   ACDF for cervidal spondylitic myleopathy   10/27/2019   KNEE SURGERY  1979   right   left heel surgery     Dr. Jane.around 2000    Family History  Problem Relation Age of Onset   Diabetes Mother    Congestive Heart Failure Mother        passed at 65   Aortic aneurysm Mother    Hypertension Mother    Stroke Mother        54   Breast cancer Father 13   Diabetes Brother    Thyroid  cancer Brother    Other Brother        adopted   Colon cancer Cousin    Social History:  reports that he has been smoking cigars. He has never used smokeless tobacco. He reports current alcohol use of about 2.0 standard drinks of alcohol per week. He reports that he does not use drugs.  Allergies: No Known Allergies  (Not in a hospital admission)   No results found for this or any previous visit (from the past 48 hours). No results found.   There were no vitals taken for this visit.  PHYSICAL EXAM: Appearance _ awake alert with no distress.  Head- atraumatiicnd no obvious abnormalities Eyes- PERRL, EOMI, no conjunctiva injection or ecchymosis Ears-  See below Nose- no lesions or masses.  Oc/OP- no lesions or excessive swelling. Mouth opening normal.  Hp/Larynx- normal voice and no airway issues. No swelling or lesions Neck- no mass or lesions. Normal movement.  Neuro-  CNII-XII intact, no sensory deficits.  Lungs- normal effort no distress noted Myringotomy and tube procedure  Patient was informed the risk and benefits of the procedure and options were discussed all questions were answered and consent was obtained.  The patient was seated in the chair with a otomicroscope direction injection of the anterior and posterior canal wall.  1% lidocaine with 1-100,000 epinephrine approximately a half a cc was injected.  A myringotomy was made in the left anterior inferior quadrant.  There was a serous middle ear effusion.  A grommet tube was placed without difficulty.  There was no bleeding.  The right ear was injected the same but there was a tympanostomy tube in the posterior inferior quadrant that was removed.  There was some purulence and a little bit of inflammation around the tympanic membrane.  A new myringotomy was made in the anterior-inferior quadrant and a grommet tube placed without difficulty.  There was some effusion in the middle ear.  A culture was taken from the middle ear purulence.  The grommet tube was placed.    The patient tolerated the procedure well.  There was no bleeding.   Assessment/Plan Right otitis media-the tube was  removed and a culture was taken which was sent.  Will see how the patient does with continued Ciprodex and see if we can direct the therapy with a culture.  Patient has a new tympanostomy tube.  The left ear had serous effusion and a grommet tube was placed without difficulty.  Patient will follow-up in about 1 month and we will call him with the results of the culture in 1 week.  Dakota Barker 03/11/2024, 11:29 AM

## 2024-03-16 LAB — AEROBIC/ANAEROBIC CULTURE W GRAM STAIN (SURGICAL/DEEP WOUND): Gram Stain: NONE SEEN

## 2024-03-17 ENCOUNTER — Telehealth (INDEPENDENT_AMBULATORY_CARE_PROVIDER_SITE_OTHER): Payer: Self-pay

## 2024-03-17 NOTE — Telephone Encounter (Signed)
 Patient called requesting a call back regarding lab results. Please advise.

## 2024-03-18 ENCOUNTER — Telehealth (INDEPENDENT_AMBULATORY_CARE_PROVIDER_SITE_OTHER): Payer: Self-pay | Admitting: Physician Assistant

## 2024-03-18 ENCOUNTER — Telehealth (INDEPENDENT_AMBULATORY_CARE_PROVIDER_SITE_OTHER): Payer: Self-pay | Admitting: Otolaryngology

## 2024-03-18 NOTE — Telephone Encounter (Signed)
 Patient called in regarding prescription.  Concerned.  Please call.

## 2024-03-18 NOTE — Telephone Encounter (Signed)
 Called patient back. I faxed paper for compounding pharmacy yesterday 11/10. I called patient back and LVM stating that the pharmacy would mail him the medication.

## 2024-03-18 NOTE — Telephone Encounter (Signed)
 I talked to him about his culture findings of the right ear.  It is growing Candida and I going to treat him with some CSF powder.  This is called to the compounding pharmacy and he will proceed getting this and he understands and received the notification.  He will come in if he needs any help dispensing this but otherwise he will come back in about a month.  He states that his right ear is only draining slightly at this point and the left ear is fine

## 2024-03-19 NOTE — Telephone Encounter (Signed)
 Called patient back regarding prescription. Patient says that he spoke with Dr. Roark and that he explained everything to him yesterday.

## 2024-03-19 NOTE — Telephone Encounter (Signed)
 I would give him the number to the compounding pharmacy so that he can call them rather than waiting on their call. They will have to speak with him prior to sending medications.

## 2024-04-08 ENCOUNTER — Ambulatory Visit (INDEPENDENT_AMBULATORY_CARE_PROVIDER_SITE_OTHER): Admitting: Otolaryngology

## 2024-04-22 ENCOUNTER — Ambulatory Visit (INDEPENDENT_AMBULATORY_CARE_PROVIDER_SITE_OTHER): Admitting: Otolaryngology

## 2024-04-22 VITALS — BP 134/81 | HR 70

## 2024-04-22 DIAGNOSIS — H6993 Unspecified Eustachian tube disorder, bilateral: Secondary | ICD-10-CM | POA: Diagnosis not present

## 2024-04-22 NOTE — Progress Notes (Signed)
 Reason for Consult: Follow-up Referring Physician: Dr. Katrinka Cordella FORBES Dakota Barker is an 68 y.o. male.  HPI: He is doing well.  He feels like the powder is collecting in his right ear.  He feels a little fullness in the right ear.  The left ear is doing great and his hearing is back to normal.  Past Medical History:  Diagnosis Date   ADHD (attention deficit hyperactivity disorder)    no treatment   Allergy    seasonal   Hematuria    Hyperlipidemia    Skin cancer    nose- radiation only. Cared for regularly Dr. Rolan molt.   Tremor    benign, essential    Past Surgical History:  Procedure Laterality Date   ACDF for cervidal spondylitic myleopathy   10/27/2019   KNEE SURGERY  1979   right   left heel surgery     Dr. Jane.around 2000    Family History  Problem Relation Age of Onset   Diabetes Mother    Congestive Heart Failure Mother        passed at 14   Aortic aneurysm Mother    Hypertension Mother    Stroke Mother        75   Breast cancer Father 21   Diabetes Brother    Thyroid  cancer Brother    Other Brother        adopted   Colon cancer Cousin     Social History:  reports that he has been smoking cigars. He has never used smokeless tobacco. He reports current alcohol use of about 2.0 standard drinks of alcohol per week. He reports that he does not use drugs.  Allergies: Allergies[1]   No results found for this or any previous visit (from the past 48 hours).  No results found.  ROS Blood pressure 134/81, pulse 70, SpO2 92%. Physical Exam Constitutional:      Appearance: Normal appearance.  HENT:     Head: Normocephalic and atraumatic.     Right Ear: The ear was cleaned out under otomicroscope direction with suction and he had a concretion of powder that was removed.  Once removed I could see the tympanic membrane and the tube was open.  Everything looks dry.  There is no further drainage.  The left ear the tube is in position open and dry.     Nose:  Nose without deviation of septum.  Turbinates with mild hypertrophy, No significant swelling or masses.     Oral cavity/oropharynx: Mucous membranes are moist. No lesions or masses    Larynx: normal voice. Mirror attempted without success    Eyes:     Extraocular Movements: Extraocular movements intact.     Conjunctiva/sclera: Conjunctivae normal.     Pupils: Pupils are equal, round, and reactive to light.  Cardiovascular:     Rate and Rhythm: Normal rate.  Pulmonary:     Effort: Pulmonary effort is normal.  Musculoskeletal:     Cervical back: Normal range of motion and neck supple. No rigidity.  Lymphadenopathy:     Cervical: No cervical adenopathy or masses.salivary glands without lesions. .     Salivary glands- no mass or swelling Neurological:     Mental Status: He is alert. CN 2-12 intact. No nystagmus      Assessment/Plan: Eustachian tube dysfunction-the powder is now removed from the canal which was completely blocking the right ear.  He immediately had resolution of his fullness.  It looks like the infection has  cleared.  I would recommend using a little bit more Ciprodex  drops in the right ear to cleanse the remaining powder and then follow-up in 6 months if he is doing well with no drainage.  Norleen Notice 04/22/2024, 9:22 AM        [1] No Known Allergies

## 2024-10-29 ENCOUNTER — Ambulatory Visit: Admitting: Urology

## 2025-02-16 ENCOUNTER — Encounter: Admitting: Family Medicine
